# Patient Record
Sex: Male | Born: 1965
Health system: Southern US, Community
[De-identification: ages and names within clinical notes are randomized; demographics above are authoritative.]

## PROBLEM LIST (undated history)

## (undated) DIAGNOSIS — Z860100 Personal history of colon polyps, unspecified: Secondary | ICD-10-CM

## (undated) DIAGNOSIS — Z8709 Personal history of other diseases of the respiratory system: Secondary | ICD-10-CM

## (undated) DIAGNOSIS — M1990 Unspecified osteoarthritis, unspecified site: Secondary | ICD-10-CM

## (undated) DIAGNOSIS — R350 Frequency of micturition: Secondary | ICD-10-CM

## (undated) DIAGNOSIS — M549 Dorsalgia, unspecified: Secondary | ICD-10-CM

## (undated) DIAGNOSIS — R200 Anesthesia of skin: Principal | ICD-10-CM

## (undated) DIAGNOSIS — J189 Pneumonia, unspecified organism: Secondary | ICD-10-CM

## (undated) DIAGNOSIS — R351 Nocturia: Secondary | ICD-10-CM

## (undated) DIAGNOSIS — Z9889 Other specified postprocedural states: Secondary | ICD-10-CM

## (undated) DIAGNOSIS — R42 Dizziness and giddiness: Secondary | ICD-10-CM

## (undated) DIAGNOSIS — Q0700 Arnold-Chiari syndrome without spina bifida or hydrocephalus: Secondary | ICD-10-CM

## (undated) DIAGNOSIS — F419 Anxiety disorder, unspecified: Secondary | ICD-10-CM

## (undated) DIAGNOSIS — F32A Depression, unspecified: Secondary | ICD-10-CM

## (undated) DIAGNOSIS — F329 Major depressive disorder, single episode, unspecified: Secondary | ICD-10-CM

## (undated) DIAGNOSIS — R112 Nausea with vomiting, unspecified: Secondary | ICD-10-CM

## (undated) DIAGNOSIS — G8929 Other chronic pain: Secondary | ICD-10-CM

## (undated) DIAGNOSIS — R531 Weakness: Secondary | ICD-10-CM

## (undated) DIAGNOSIS — Z9289 Personal history of other medical treatment: Secondary | ICD-10-CM

## (undated) DIAGNOSIS — Z8619 Personal history of other infectious and parasitic diseases: Secondary | ICD-10-CM

## (undated) HISTORY — DX: Anesthesia of skin: R20.0

## (undated) HISTORY — PX: OTHER SURGICAL HISTORY: SHX169

## (undated) HISTORY — DX: Arnold-Chiari syndrome without spina bifida or hydrocephalus: Q07.00

## (undated) HISTORY — PX: CRANIECTOMY SUBOCCIPITAL W/ CERVICAL LAMINECTOMY / CHIARI: SUR327

## (undated) HISTORY — PX: COLONOSCOPY: SHX174

## (undated) HISTORY — PX: ESOPHAGOGASTRODUODENOSCOPY: SHX1529

---

## 1989-06-22 HISTORY — PX: THUMB ARTHROSCOPY: SHX2509

## 2000-11-23 ENCOUNTER — Encounter: Admission: RE | Admit: 2000-11-23 | Discharge: 2000-11-23 | Payer: Self-pay | Admitting: Urology

## 2000-11-23 ENCOUNTER — Encounter: Payer: Self-pay | Admitting: Urology

## 2001-01-03 ENCOUNTER — Other Ambulatory Visit: Admission: RE | Admit: 2001-01-03 | Discharge: 2001-01-03 | Payer: Self-pay | Admitting: Urology

## 2012-11-24 ENCOUNTER — Other Ambulatory Visit: Payer: Self-pay | Admitting: Family Medicine

## 2012-11-24 DIAGNOSIS — Z139 Encounter for screening, unspecified: Secondary | ICD-10-CM

## 2012-11-24 DIAGNOSIS — M5412 Radiculopathy, cervical region: Secondary | ICD-10-CM

## 2012-11-28 ENCOUNTER — Ambulatory Visit
Admission: RE | Admit: 2012-11-28 | Discharge: 2012-11-28 | Disposition: A | Discharge status: Home / Self Care | Payer: PRIVATE HEALTH INSURANCE | Source: Ambulatory Visit | Attending: Family Medicine | Admitting: Family Medicine

## 2012-11-28 DIAGNOSIS — M5412 Radiculopathy, cervical region: Secondary | ICD-10-CM

## 2012-11-28 DIAGNOSIS — Z139 Encounter for screening, unspecified: Secondary | ICD-10-CM

## 2012-12-05 ENCOUNTER — Other Ambulatory Visit: Payer: Self-pay

## 2013-03-30 ENCOUNTER — Other Ambulatory Visit: Payer: Self-pay | Admitting: Neurosurgery

## 2013-03-31 ENCOUNTER — Encounter (HOSPITAL_COMMUNITY): Payer: Self-pay | Admitting: Pharmacist

## 2013-04-04 ENCOUNTER — Other Ambulatory Visit (HOSPITAL_COMMUNITY): Payer: Self-pay | Admitting: *Deleted

## 2013-04-04 NOTE — Pre-Procedure Instructions (Signed)
Tony Richardson  04/04/2013   Your procedure is scheduled on:  Friday, April 07, 2013 at 7:30 AM.   Report to [facility] Entrance "A" at 5:30 AM.   Call this number if you have problems the morning of surgery: [phone]   Remember:   Do not eat food or drink liquids after midnight Thursday, 04/06/13.   Take these medicines the morning of surgery with A SIP OF WATER: gabapentin (NEURONTIN)    Do not wear jewelry.  Do not wear lotions, powders, or cologne. You may wear deodorant.  Do not shave 48 hours prior to surgery. Men may shave face and neck.  Do not bring valuables to the hospital.  [facility] is not responsible                  for any belongings or valuables.               Contacts, dentures or bridgework may not be worn into surgery.  Leave suitcase in the car. After surgery it may be brought to your room.  For patients admitted to the hospital, discharge time is determined by your                treatment team.             Special Instructions: Shower using CHG 2 nights before surgery and the night before surgery.  If you shower the day of surgery use CHG.  Use special wash - you have one bottle of CHG for all showers.  You should use approximately 1/3 of the bottle for each shower.   Please read over the following fact sheets that you were given: Pain Booklet, Coughing and Deep Breathing, Blood Transfusion Information, MRSA Information and Surgical Site Infection Prevention

## 2013-04-05 ENCOUNTER — Encounter (HOSPITAL_COMMUNITY)
Admission: RE | Admit: 2013-04-05 | Discharge: 2013-04-05 | Disposition: A | Discharge status: Home / Self Care | Payer: PRIVATE HEALTH INSURANCE | Source: Ambulatory Visit | Attending: Neurosurgery | Admitting: Neurosurgery

## 2013-04-05 ENCOUNTER — Encounter (HOSPITAL_COMMUNITY): Payer: Self-pay

## 2013-04-05 DIAGNOSIS — Z01812 Encounter for preprocedural laboratory examination: Secondary | ICD-10-CM | POA: Insufficient documentation

## 2013-04-05 LAB — BASIC METABOLIC PANEL
BUN: 6 mg/dL (ref 6–23)
CO2: 26 mEq/L (ref 19–32)
Creatinine, Ser: 0.71 mg/dL (ref 0.50–1.35)
GFR calc Af Amer: >90 mL/min (ref >90)
GFR calc non Af Amer: >90 mL/min (ref >90)

## 2013-04-05 LAB — CBC
MCHC: 34.3 g/dL (ref 30.0–36.0)
RBC: 4.65 MIL/uL (ref 4.22–5.81)
RDW: 13.5 % (ref 11.5–15.5)

## 2013-04-05 LAB — SURGICAL PCR SCREEN
MRSA, PCR: NEGATIVE
Staphylococcus aureus: NEGATIVE

## 2013-04-05 LAB — ABO/RH: ABO/RH(D): A POS

## 2013-04-05 NOTE — Pre-Procedure Instructions (Signed)
MASIN SHATTO  04/05/2013   Your procedure is scheduled on:  04/07/13  Report to [facility]  2 * 3 at 530 AM.  Call this number if you have problems the morning of surgery: [phone]   Remember:   Do not eat food or drink liquids after midnight.   Take these medicines the morning of surgery with A SIP OF WATER: neurontin   Do not wear jewelry, make-up or nail polish.  Do not wear lotions, powders, or perfumes. You may wear deodorant.  Do not shave 48 hours prior to surgery. Men may shave face and neck.  Do not bring valuables to the hospital.  [facility] is not responsible                  for any belongings or valuables.               Contacts, dentures or bridgework may not be worn into surgery.  Leave suitcase in the car. After surgery it may be brought to your room.  For patients admitted to the hospital, discharge time is determined by your                treatment team.               Patients discharged the day of surgery will not be allowed to drive  home.  Name and phone number of your driver: wife  Special Instructions: Shower using CHG 2 nights before surgery and the night before surgery.  If you shower the day of surgery use CHG.  Use special wash - you have one bottle of CHG for all showers.  You should use approximately 1/3 of the bottle for each shower.   Please read over the following fact sheets that you were given: Pain Booklet, Coughing and Deep Breathing, Blood Transfusion Information, MRSA Information and Surgical Site Infection Prevention

## 2013-04-05 NOTE — Pre-Procedure Instructions (Signed)
KALADIN NOSEWORTHY  04/05/2013   Your procedure is scheduled on:  04/07/13  Report to [facility]  2 * 3 at 530 AM.  Call this number if you have problems the morning of surgery: [phone]   Remember:   Do not eat food or drink liquids after midnight.   Take these medicines the morning of surgery with A SIP OF WATER: neurotin   Do not wear jewelry, make-up or nail polish.  Do not wear lotions, powders, or perfumes. You may wear deodorant.  Do not shave 48 hours prior to surgery. Men may shave face and neck.  Do not bring valuables to the hospital.  [facility] is not responsible                  for any belongings or valuables.               Contacts, dentures or bridgework may not be worn into surgery.  Leave suitcase in the car. After surgery it may be brought to your room.  For patients admitted to the hospital, discharge time is determined by your                treatment team.               Patients discharged the day of surgery will not be allowed to drive  home.  Name and phone number of your driver: family  Special Instructions: Incentive Spirometry - Practice and bring it with you on the day of surgery.   Please read over the following fact sheets that you were given: Pain Booklet, Coughing and Deep Breathing, Blood Transfusion Information, MRSA Information and Surgical Site Infection Prevention

## 2013-04-06 MED ORDER — CEFAZOLIN SODIUM-DEXTROSE 2-3 GM-% IV SOLR
2.0000 g | INTRAVENOUS | Status: AC
  Administered 2013-04-07: 2 g via INTRAVENOUS
  Filled 2013-04-06: qty 50

## 2013-04-07 ENCOUNTER — Encounter (HOSPITAL_COMMUNITY)
Admission: RE | Disposition: A | Discharge status: Home / Self Care | Payer: Self-pay | Source: Ambulatory Visit | Attending: Neurosurgery

## 2013-04-07 ENCOUNTER — Encounter (HOSPITAL_COMMUNITY): Payer: PRIVATE HEALTH INSURANCE | Admitting: Certified Registered Nurse Anesthetist

## 2013-04-07 ENCOUNTER — Inpatient Hospital Stay (HOSPITAL_COMMUNITY)
Admission: RE | Admit: 2013-04-07 | Discharge: 2013-04-12 | DRG: 030 | Disposition: A | Discharge status: Home / Self Care | Payer: PRIVATE HEALTH INSURANCE | Source: Ambulatory Visit | Attending: Neurosurgery | Admitting: Neurosurgery

## 2013-04-07 ENCOUNTER — Inpatient Hospital Stay (HOSPITAL_COMMUNITY): Payer: PRIVATE HEALTH INSURANCE | Admitting: Certified Registered Nurse Anesthetist

## 2013-04-07 ENCOUNTER — Encounter (HOSPITAL_COMMUNITY): Payer: Self-pay | Admitting: *Deleted

## 2013-04-07 DIAGNOSIS — G935 Compression of brain: Principal | ICD-10-CM | POA: Diagnosis present

## 2013-04-07 DIAGNOSIS — Z01812 Encounter for preprocedural laboratory examination: Secondary | ICD-10-CM

## 2013-04-07 DIAGNOSIS — M47812 Spondylosis without myelopathy or radiculopathy, cervical region: Secondary | ICD-10-CM | POA: Diagnosis present

## 2013-04-07 DIAGNOSIS — M5030 Other cervical disc degeneration, unspecified cervical region: Secondary | ICD-10-CM | POA: Diagnosis present

## 2013-04-07 HISTORY — PX: SUBOCCIPITAL CRANIECTOMY CERVICAL LAMINECTOMY: SHX5404

## 2013-04-07 SURGERY — SUBOCCIPITAL CRANIECTOMY CERVICAL LAMINECTOMY/DURAPLASTY
Anesthesia: General | Wound class: Clean

## 2013-04-07 MED ORDER — HYDROMORPHONE HCL PF 1 MG/ML IJ SOLN
INTRAMUSCULAR | Status: AC
  Filled 2013-04-07: qty 1

## 2013-04-07 MED ORDER — GABAPENTIN 600 MG PO TABS
600.0000 mg | ORAL_TABLET | Freq: Three times a day (TID) | ORAL | Status: DC
  Administered 2013-04-07 – 2013-04-12 (×16): 600 mg via ORAL
  Filled 2013-04-07 (×18): qty 1

## 2013-04-07 MED ORDER — SODIUM CHLORIDE 0.9 % IV SOLN: 250.0000 mL | INTRAVENOUS | Status: DC

## 2013-04-07 MED ORDER — FENTANYL CITRATE 0.05 MG/ML IJ SOLN
INTRAMUSCULAR | Status: DC | PRN
Start: 2013-04-07 — End: 2013-04-07
  Administered 2013-04-07: 250 ug via INTRAVENOUS

## 2013-04-07 MED ORDER — ACETAMINOPHEN 650 MG RE SUPP: 650.0000 mg | RECTAL | Status: DC | PRN

## 2013-04-07 MED ORDER — ROCURONIUM BROMIDE 100 MG/10ML IV SOLN
INTRAVENOUS | Status: DC | PRN
  Administered 2013-04-07: 50 mg via INTRAVENOUS

## 2013-04-07 MED ORDER — MIDAZOLAM HCL 5 MG/5ML IJ SOLN
INTRAMUSCULAR | Status: DC | PRN
  Administered 2013-04-07: 2 mg via INTRAVENOUS

## 2013-04-07 MED ORDER — HEMOSTATIC AGENTS (NO CHARGE) OPTIME
TOPICAL | Status: DC | PRN
  Administered 2013-04-07: 1 via TOPICAL

## 2013-04-07 MED ORDER — DIPHENHYDRAMINE HCL 50 MG/ML IJ SOLN: 12.5000 mg | Freq: Four times a day (QID) | INTRAMUSCULAR | Status: DC | PRN

## 2013-04-07 MED ORDER — OXYCODONE-ACETAMINOPHEN 5-325 MG PO TABS
1.0000 | ORAL_TABLET | ORAL | Status: DC | PRN
  Administered 2013-04-08: 2 via ORAL
  Administered 2013-04-08: 1 via ORAL
  Administered 2013-04-08 – 2013-04-10 (×8): 2 via ORAL
  Administered 2013-04-11 (×2): 1 via ORAL
  Administered 2013-04-11 – 2013-04-12 (×2): 2 via ORAL
  Administered 2013-04-12: 1 via ORAL
  Filled 2013-04-07 (×3): qty 2
  Filled 2013-04-07: qty 1
  Filled 2013-04-07: qty 2
  Filled 2013-04-07: qty 1
  Filled 2013-04-07 (×6): qty 2
  Filled 2013-04-07: qty 1
  Filled 2013-04-07 (×2): qty 2

## 2013-04-07 MED ORDER — HYDRALAZINE HCL 20 MG/ML IJ SOLN: 5.0000 mg | INTRAMUSCULAR | Status: DC | PRN

## 2013-04-07 MED ORDER — DEXAMETHASONE 4 MG PO TABS
4.0000 mg | ORAL_TABLET | Freq: Four times a day (QID) | ORAL | Status: DC
  Administered 2013-04-07 – 2013-04-12 (×15): 4 mg via ORAL
  Filled 2013-04-07 (×23): qty 1

## 2013-04-07 MED ORDER — ONDANSETRON HCL 4 MG/2ML IJ SOLN
4.0000 mg | INTRAMUSCULAR | Status: DC | PRN
  Administered 2013-04-08 – 2013-04-12 (×6): 4 mg via INTRAVENOUS
  Filled 2013-04-07 (×6): qty 2

## 2013-04-07 MED ORDER — PROPOFOL 10 MG/ML IV BOLUS
INTRAVENOUS | Status: DC | PRN
  Administered 2013-04-07: 200 mg via INTRAVENOUS

## 2013-04-07 MED ORDER — GLYCOPYRROLATE 0.2 MG/ML IJ SOLN
INTRAMUSCULAR | Status: DC | PRN
  Administered 2013-04-07: .4 mg via INTRAVENOUS

## 2013-04-07 MED ORDER — BUPIVACAINE-EPINEPHRINE 0.5% -1:200000 IJ SOLN
INTRAMUSCULAR | Status: DC | PRN
  Administered 2013-04-07: 20 mL

## 2013-04-07 MED ORDER — THROMBIN 5000 UNITS EX SOLR
CUTANEOUS | Status: DC | PRN
  Administered 2013-04-07 (×2): 5000 [IU] via TOPICAL

## 2013-04-07 MED ORDER — ONDANSETRON HCL 4 MG/2ML IJ SOLN
INTRAMUSCULAR | Status: DC | PRN
  Administered 2013-04-07: 4 mg via INTRAMUSCULAR

## 2013-04-07 MED ORDER — SODIUM CHLORIDE 0.9 % IJ SOLN
3.0000 mL | Freq: Two times a day (BID) | INTRAMUSCULAR | Status: DC
  Administered 2013-04-07 – 2013-04-11 (×10): 3 mL via INTRAVENOUS

## 2013-04-07 MED ORDER — DIPHENHYDRAMINE HCL 12.5 MG/5ML PO ELIX
12.5000 mg | ORAL_SOLUTION | Freq: Four times a day (QID) | ORAL | Status: DC | PRN
  Filled 2013-04-07: qty 5

## 2013-04-07 MED ORDER — SODIUM CHLORIDE 0.9 % IJ SOLN: 3.0000 mL | INTRAMUSCULAR | Status: DC | PRN

## 2013-04-07 MED ORDER — OXYCODONE HCL 5 MG/5ML PO SOLN: 5.0000 mg | Freq: Once | ORAL | Status: DC | PRN

## 2013-04-07 MED ORDER — ACETAMINOPHEN 325 MG PO TABS: 650.0000 mg | ORAL_TABLET | ORAL | Status: DC | PRN

## 2013-04-07 MED ORDER — SODIUM CHLORIDE 0.9 % IV SOLN
INTRAVENOUS | Status: DC
  Administered 2013-04-07 – 2013-04-08 (×3): via INTRAVENOUS

## 2013-04-07 MED ORDER — OXYCODONE HCL 5 MG PO TABS: 5.0000 mg | ORAL_TABLET | Freq: Once | ORAL | Status: DC | PRN

## 2013-04-07 MED ORDER — PHENOL 1.4 % MT LIQD: 1.0000 | OROMUCOSAL | Status: DC | PRN

## 2013-04-07 MED ORDER — LIDOCAINE HCL (CARDIAC) 20 MG/ML IV SOLN
INTRAVENOUS | Status: DC | PRN
  Administered 2013-04-07: 100 mg via INTRAVENOUS

## 2013-04-07 MED ORDER — DEXAMETHASONE SODIUM PHOSPHATE 4 MG/ML IJ SOLN
4.0000 mg | Freq: Four times a day (QID) | INTRAMUSCULAR | Status: DC
  Administered 2013-04-07 – 2013-04-11 (×6): 4 mg via INTRAVENOUS
  Filled 2013-04-07 (×22): qty 1

## 2013-04-07 MED ORDER — MORPHINE SULFATE (PF) 1 MG/ML IV SOLN
INTRAVENOUS | Status: DC
  Administered 2013-04-07: 11:00:00 via INTRAVENOUS
  Administered 2013-04-07: 25 mL via INTRAVENOUS
  Administered 2013-04-07: 19.5 mg via INTRAVENOUS
  Administered 2013-04-08: 10.5 mg via INTRAVENOUS
  Administered 2013-04-08: 08:00:00 via INTRAVENOUS
  Administered 2013-04-08: 16 mg via INTRAVENOUS
  Filled 2013-04-07 (×3): qty 25

## 2013-04-07 MED ORDER — ZOLPIDEM TARTRATE 5 MG PO TABS
10.0000 mg | ORAL_TABLET | Freq: Every evening | ORAL | Status: DC | PRN
  Filled 2013-04-07: qty 2

## 2013-04-07 MED ORDER — NEOSTIGMINE METHYLSULFATE 1 MG/ML IJ SOLN
INTRAMUSCULAR | Status: DC | PRN
  Administered 2013-04-07: 3 mg via INTRAVENOUS

## 2013-04-07 MED ORDER — 0.9 % SODIUM CHLORIDE (POUR BTL) OPTIME
TOPICAL | Status: DC | PRN
  Administered 2013-04-07 (×2): 1000 mL

## 2013-04-07 MED ORDER — ONDANSETRON HCL 4 MG/2ML IJ SOLN
INTRAMUSCULAR | Status: AC
  Filled 2013-04-07: qty 2

## 2013-04-07 MED ORDER — PROMETHAZINE HCL 25 MG/ML IJ SOLN: 6.2500 mg | INTRAMUSCULAR | Status: DC | PRN

## 2013-04-07 MED ORDER — DIAZEPAM 5 MG PO TABS: 5.0000 mg | ORAL_TABLET | Freq: Four times a day (QID) | ORAL | Status: DC | PRN

## 2013-04-07 MED ORDER — SODIUM CHLORIDE 0.9 % IJ SOLN: 9.0000 mL | INTRAMUSCULAR | Status: DC | PRN

## 2013-04-07 MED ORDER — LABETALOL HCL 5 MG/ML IV SOLN: 5.0000 mg | INTRAVENOUS | Status: DC | PRN

## 2013-04-07 MED ORDER — MORPHINE SULFATE (PF) 1 MG/ML IV SOLN
INTRAVENOUS | Status: AC
  Administered 2013-04-07: 12:00:00
  Filled 2013-04-07: qty 25

## 2013-04-07 MED ORDER — HYDROMORPHONE HCL PF 1 MG/ML IJ SOLN
0.2500 mg | INTRAMUSCULAR | Status: DC | PRN
  Administered 2013-04-07: 0.5 mg via INTRAVENOUS

## 2013-04-07 MED ORDER — LACTATED RINGERS IV SOLN
INTRAVENOUS | Status: DC | PRN
  Administered 2013-04-07 (×2): via INTRAVENOUS

## 2013-04-07 MED ORDER — NALOXONE HCL 0.4 MG/ML IJ SOLN: 0.4000 mg | INTRAMUSCULAR | Status: DC | PRN

## 2013-04-07 MED ORDER — CEFAZOLIN SODIUM 1-5 GM-% IV SOLN
1.0000 g | Freq: Three times a day (TID) | INTRAVENOUS | Status: AC
  Administered 2013-04-07 (×2): 1 g via INTRAVENOUS
  Filled 2013-04-07 (×2): qty 50

## 2013-04-07 MED ORDER — MENTHOL 3 MG MT LOZG: 1.0000 | LOZENGE | OROMUCOSAL | Status: DC | PRN

## 2013-04-07 MED ORDER — ONDANSETRON HCL 4 MG/2ML IJ SOLN
4.0000 mg | Freq: Four times a day (QID) | INTRAMUSCULAR | Status: DC | PRN
  Administered 2013-04-07: 4 mg via INTRAVENOUS

## 2013-04-07 MED ORDER — DEXAMETHASONE SODIUM PHOSPHATE 4 MG/ML IJ SOLN
INTRAMUSCULAR | Status: DC | PRN
  Administered 2013-04-07: 8 mg via INTRAVENOUS

## 2013-04-07 SURGICAL SUPPLY — 62 items
BENZOIN TINCTURE PRP APPL 2/3 (GAUZE/BANDAGES/DRESSINGS) ×2 IMPLANT
BLADE SURG ROTATE 9660 (MISCELLANEOUS) ×2 IMPLANT
BLADE ULTRA TIP 2M (BLADE) ×2 IMPLANT
BUR ACORN 6.0 PRECISION (BURR) ×2 IMPLANT
BUR MATCHSTICK NEURO 3.0 LAGG (BURR) IMPLANT
BUR RND OSTEON ELITE 6.0 (BURR) IMPLANT
CANISTER SUCTION 2500CC (MISCELLANEOUS) ×2 IMPLANT
CLIP TI MEDIUM 6 (CLIP) IMPLANT
CONT SPEC 4OZ CLIKSEAL STRL BL (MISCELLANEOUS) ×2 IMPLANT
CORDS BIPOLAR (ELECTRODE) ×2 IMPLANT
COVER MAYO STAND STRL (DRAPES) IMPLANT
COVER TABLE BACK 60X90 (DRAPES) IMPLANT
DRAPE LAPAROTOMY 100X72 PEDS (DRAPES) ×2 IMPLANT
DRAPE MICROSCOPE LEICA (MISCELLANEOUS) ×2 IMPLANT
DRAPE WARM FLUID 44X44 (DRAPE) ×2 IMPLANT
DURAGUARD 04CMX04CM ×2 IMPLANT
DURASEAL APPLICATOR TIP (TIP) ×4 IMPLANT
DURASEAL SPINE SEALANT 3ML (MISCELLANEOUS) ×4 IMPLANT
ELECT BLADE 4.0 EZ CLEAN MEGAD (MISCELLANEOUS) ×2
ELECT CAUTERY BLADE 6.4 (BLADE) ×2 IMPLANT
ELECT REM PT RETURN 9FT ADLT (ELECTROSURGICAL) ×2
ELECTRODE BLDE 4.0 EZ CLN MEGD (MISCELLANEOUS) ×1 IMPLANT
ELECTRODE REM PT RTRN 9FT ADLT (ELECTROSURGICAL) ×1 IMPLANT
GAUZE SPONGE 4X4 16PLY XRAY LF (GAUZE/BANDAGES/DRESSINGS) IMPLANT
GLOVE BIOGEL M 8.0 STRL (GLOVE) ×2 IMPLANT
GLOVE ECLIPSE 7.5 STRL STRAW (GLOVE) ×10 IMPLANT
GLOVE EXAM NITRILE LRG STRL (GLOVE) IMPLANT
GLOVE EXAM NITRILE MD LF STRL (GLOVE) IMPLANT
GLOVE EXAM NITRILE XL STR (GLOVE) IMPLANT
GLOVE EXAM NITRILE XS STR PU (GLOVE) IMPLANT
GLOVE INDICATOR 8.0 STRL GRN (GLOVE) ×2 IMPLANT
GOWN BRE IMP SLV AUR LG STRL (GOWN DISPOSABLE) ×2 IMPLANT
GOWN BRE IMP SLV AUR XL STRL (GOWN DISPOSABLE) ×2 IMPLANT
GOWN STRL REIN 2XL LVL4 (GOWN DISPOSABLE) IMPLANT
KIT BASIN OR (CUSTOM PROCEDURE TRAY) ×2 IMPLANT
KIT ROOM TURNOVER OR (KITS) ×2 IMPLANT
NEEDLE HYPO 25X1 1.5 SAFETY (NEEDLE) ×2 IMPLANT
NEEDLE SPNL 22GX3.5 QUINCKE BK (NEEDLE) ×2 IMPLANT
NS IRRIG 1000ML POUR BTL (IV SOLUTION) ×4 IMPLANT
PACK CRANIOTOMY (CUSTOM PROCEDURE TRAY) ×2 IMPLANT
PAD ARMBOARD 7.5X6 YLW CONV (MISCELLANEOUS) ×6 IMPLANT
PATTIES SURGICAL 1/4 X 3 (GAUZE/BANDAGES/DRESSINGS) IMPLANT
PIN MAYFIELD SKULL DISP (PIN) IMPLANT
RUBBERBAND STERILE (MISCELLANEOUS) IMPLANT
SPONGE GAUZE 4X4 12PLY (GAUZE/BANDAGES/DRESSINGS) ×2 IMPLANT
SPONGE LAP 4X18 X RAY DECT (DISPOSABLE) IMPLANT
SPONGE SURGIFOAM ABS GEL 100C (HEMOSTASIS) ×2 IMPLANT
STAPLER SKIN PROX WIDE 3.9 (STAPLE) ×2 IMPLANT
STRIP CLOSURE SKIN 1/4X4 (GAUZE/BANDAGES/DRESSINGS) IMPLANT
SUT ETHILON 3 0 FSL (SUTURE) IMPLANT
SUT NURALON 4 0 TR CR/8 (SUTURE) ×2 IMPLANT
SUT PROLENE 6 0 BV (SUTURE) IMPLANT
SUT VIC AB 0 CT1 18XCR BRD8 (SUTURE) ×2 IMPLANT
SUT VIC AB 0 CT1 8-18 (SUTURE) ×2
SUT VIC AB 2-0 CP2 18 (SUTURE) ×4 IMPLANT
SUT VIC AB 3-0 SH 8-18 (SUTURE) ×2 IMPLANT
SYR 20ML ECCENTRIC (SYRINGE) ×2 IMPLANT
TOWEL OR 17X24 6PK STRL BLUE (TOWEL DISPOSABLE) ×2 IMPLANT
TOWEL OR 17X26 10 PK STRL BLUE (TOWEL DISPOSABLE) ×2 IMPLANT
TRAY FOLEY CATH 14FRSI W/METER (CATHETERS) ×2 IMPLANT
UNDERPAD 30X30 INCONTINENT (UNDERPADS AND DIAPERS) IMPLANT
WATER STERILE IRR 1000ML POUR (IV SOLUTION) ×2 IMPLANT

## 2013-04-07 NOTE — Progress Notes (Signed)
Patient received from PACU via bed. VSS. Drowsy, arousable, follows commands appropriately. Orientedx4. Foley catheter draining clear yellow urine. PIVx2 and R radial aline. NS@ 75cc/hr. SCDs on BLE. C/O post-surgical pain 8/10-reinforced using PCA for pain management. See flowsheet for full assessment. Family updated at bedside. Continuing to monitor.

## 2013-04-07 NOTE — Progress Notes (Signed)
UR completed.  Tamula Morrical, RN BSN [facility] CCM Trauma/Neuro ICU Case Manager [phone]  

## 2013-04-07 NOTE — Transfer of Care (Signed)
Immediate Anesthesia Transfer of Care Note  Patient: Tony Richardson  Procedure(s) Performed: Procedure(s): SUBOCCIPITAL CRANIECTOMY CERVICAL LAMINECTOMY/DURAPLASTY RESECTION POSTERIOR CERVICAL ONE (N/A)  Patient Location: PACU  Anesthesia Type:General  Level of Consciousness: awake, alert  and oriented  Airway & Oxygen Therapy: Patient Spontanous Breathing and Patient connected to nasal cannula oxygen  Post-op Assessment: Report given to PACU RN and Post -op Vital signs reviewed and stable  Post vital signs: Reviewed and stable  Complications: No apparent anesthesia complications

## 2013-04-07 NOTE — Progress Notes (Signed)
4cc morphine PCA wasted in sink. Witnessed by Cyndie Chime, RN

## 2013-04-07 NOTE — Progress Notes (Signed)
Op [phone]

## 2013-04-07 NOTE — Anesthesia Postprocedure Evaluation (Signed)
Anesthesia Post Note  Patient: Tony Richardson  Procedure(s) Performed: Procedure(s) (LRB): SUBOCCIPITAL CRANIECTOMY CERVICAL LAMINECTOMY/DURAPLASTY RESECTION POSTERIOR CERVICAL ONE (N/A)  Anesthesia type: general  Patient location: PACU  Post pain: Pain level controlled  Post assessment: Patient's Cardiovascular Status Stable  Last Vitals:  Filed Vitals:   04/07/13 1051  BP: 125/94  Pulse: 66  Temp:   Resp: 14    Post vital signs: Reviewed and stable  Level of consciousness: sedated  Complications: No apparent anesthesia complications

## 2013-04-07 NOTE — Op Note (Signed)
NAMEGILLES, TRIMPE                   ACCOUNT NO.:  [id number]  MEDICAL RECORD NO.:  [id number]  LOCATION:  3M13C                        FACILITY:  [facility]  PHYSICIAN:  Hilda Lias, M.D.   DATE OF BIRTH:  05-Oct-1965  DATE OF PROCEDURE:  04/07/2013 DATE OF DISCHARGE:                              OPERATIVE REPORT   PREOPERATIVE DIAGNOSES:  Arnold-Chiari malformation.  Degenerative disk disease, cervical 5-6, 6-7.  POSTOPERATIVE DIAGNOSES:  Arnold-Chiari malformation. Degenerative disk disease, cervical 5-6, 6-7. PROCEDURE:  Bilateral occipital craniectomy.  Removal of the arch of C1. Decompression of the posterior fossa.  A pouch using Dura-Guard. Microscope.  SURGEON:  Hilda Lias, M.D.  ASSISTANT:  Hewitt Shorts, M.D.  CLINICAL HISTORY:  Mr. Shugars is a gentleman who was seen by me complaining of neck pain, dizziness and __________ headache which gets worse with cough.  Also weakness in _both_________ upper extremities.  MRI showed that he has Arnold-Chiari malformation with the tonsils of the cerebellum going to C1 and beyond.  Also, he has a degenerative disk disease in the cervical 5-6, 6-7.  Surgery was advised and the procedure was to decompress the posterior fossa.  He knew the risk of  the surgery.  Later on, we will address the problem in the anterior cervical spine.  DESCRIPTION OF PROCEDURE:  The patient was taken to the OR and after intubation, 3 pins were applied to the head.  He was positioned in prone manner, and the area was shaved and prepped with Betadine.  Drapes were applied.  Then, midline incision from the inion to C2-C3 was made through the skin, subcutaneous tissue.  Muscles were retracted laterally.  At the end, we had good exposure of both __________ occipital bones as well as for C1 and C2.  Then using the drill, we thin out the bone __________to do a craniectomy which was accomplished at the end using the Kerrison punch.  We had a wide  craniectomy with plenty of space for the cerebellar hemisphere.  Then, we made an incision in shape of Y, all the way to the midline.  What we found was that the cerebellum, tonsils were going below C1 and because of that, we proceeded with resection of the arch of C1.  The cerebellum was close about 5 mm away from the C2. Incision was made in the midline.  Decompression was achieved.  We took care not to open the arachnoid space.  Then, using Dura-Guard, we tailored a pouch to allow more room for the posterior element.  The Dura- Guard was attached to the edges of the dura mater using 4-0 Nurolon.  At the end, we had a good decompression with plenty of space.  Valsalva maneuver was negative.  Then, surgical glue were used for the edges.  Then, the wound was closed with 4 different layers with Vicryl and staples.  The patient is stable and he is going to go to the intensive care unit.          ______________________________ Hilda Lias, M.D.     EB/MEDQ  D:  04/07/2013  T:  04/07/2013  Job:  [id number]

## 2013-04-07 NOTE — Preoperative (Signed)
Beta Blockers   Reason not to administer Beta Blockers:Not Applicable 

## 2013-04-07 NOTE — H&P (Signed)
Tony Richardson is an 47 y.o. male.   Chief Complaint: headacheHPI: patient seen in the office with complains of headache which gets worse with coughing, associated with eye blurriness ,pain to the arms right worse than left. No better with treatment  Past Medical History  Diagnosis Date  . ZOXWRUEA(540.9)     Past Surgical History  Procedure Laterality Date  . Thumb arthroscopy      History reviewed. No pertinent family history. Social History:  reports that he has never smoked. He has never used smokeless tobacco. He reports that he drinks alcohol. He reports that he does not use illicit drugs.  Allergies: No Known Allergies  Medications Prior to Admission  Medication Sig Dispense Refill  . gabapentin (NEURONTIN) 600 MG tablet Take 600 mg by mouth 3 (three) times daily.      . sildenafil (VIAGRA) 100 MG tablet Take 100 mg by mouth daily as needed for erectile dysfunction.        Results for orders placed during the hospital encounter of 04/05/13 (from the past 48 hour(s))  SURGICAL PCR SCREEN     Status: None   Collection Time    04/05/13  8:30 AM      Result Value Range   MRSA, PCR NEGATIVE  NEGATIVE   Staphylococcus aureus NEGATIVE  NEGATIVE   Comment:            The Xpert SA Assay (FDA     approved for NASAL specimens     in patients over 30 years of age),     is one component of     a comprehensive surveillance     program.  Test performance has     been validated by [employer] for patients greater     than or equal to 70 year old.     It is not intended     to diagnose infection nor to     guide or monitor treatment.  BASIC METABOLIC PANEL     Status: None   Collection Time    04/05/13  8:34 AM      Result Value Range   Sodium 141  135 - 145 mEq/L   Potassium 3.6  3.5 - 5.1 mEq/L   Chloride 105  96 - 112 mEq/L   CO2 26  19 - 32 mEq/L   Glucose, Bld 96  70 - 99 mg/dL   BUN 6  6 - 23 mg/dL   Creatinine, Ser 8.11  0.50 - 1.35 mg/dL   Calcium 9.1  8.4 - 91.4  mg/dL   GFR calc non Af Amer >90  >90 mL/min   GFR calc Af Amer >90  >90 mL/min   Comment: (NOTE)     The eGFR has been calculated using the CKD EPI equation.     This calculation has not been validated in all clinical situations.     eGFR's persistently <90 mL/min signify possible Chronic Kidney     Disease.  CBC     Status: Abnormal   Collection Time    04/05/13  8:34 AM      Result Value Range   WBC 10.6 (*) 4.0 - 10.5 K/uL   RBC 4.65  4.22 - 5.81 MIL/uL   Hemoglobin 14.2  13.0 - 17.0 g/dL   HCT 78.2  95.6 - 21.3 %   MCV 89.0  78.0 - 100.0 fL   MCH 30.5  26.0 - 34.0 pg   MCHC 34.3  30.0 - 36.0 g/dL   RDW 16.1  09.6 - 04.5 %   Platelets 189  150 - 400 K/uL  TYPE AND SCREEN     Status: None   Collection Time    04/05/13  8:37 AM      Result Value Range   ABO/RH(D) A POS     Antibody Screen NEG     Sample Expiration 04/19/2013    ABO/RH     Status: None   Collection Time    04/05/13  8:37 AM      Result Value Range   ABO/RH(D) A POS     No results found.  Review of Systems  Constitutional: Negative.   Eyes: Positive for blurred vision.  Respiratory: Negative.   Cardiovascular: Negative.   Gastrointestinal: Negative.   Genitourinary: Negative.   Musculoskeletal: Positive for neck pain.  Skin: Negative.   Neurological: Positive for sensory change, focal weakness and headaches.  Endo/Heme/Allergies: Negative.   Psychiatric/Behavioral: Negative.     Blood pressure 124/85, pulse 74, temperature 97.5 F (36.4 C), temperature source Oral, resp. rate 18, SpO2 97.00%. Physical Exam hent, some posterior tenderness. Neck, pain with movement. Cv, nl. Lungs clear. Abdomen, soft. Extremities, nl. NEURO weakness of biceps. Sensory., numbness of thumb. No biceps refkex. mti shoes Debroah Loop- Chiari malformation with cerebellum down to 11 mms below foramne  Magnum. Also ddd at cervical 56, 67  Assessment/Plan decopmfression of posterior fossa with a bioccipital craniectomy and  possible removal of c1. The procedure will not  Improve his neck and arm weakness which will involce acdf at later time. He and his family are aware of risks and benefits.  Jeidy Hoerner M 04/07/2013, 7:50 AM

## 2013-04-07 NOTE — Anesthesia Preprocedure Evaluation (Signed)
Anesthesia Evaluation  Patient identified by MRN, date of birth, ID band Patient awake    Reviewed: Allergy & Precautions, H&P , NPO status , Patient's Chart, lab work & pertinent test results  History of Anesthesia Complications Negative for: history of anesthetic complications  Airway Mallampati: II TM Distance: >3 FB Neck ROM: Full    Dental  (+) Teeth Intact and Dental Advisory Given   Pulmonary    Pulmonary exam normal       Cardiovascular negative cardio ROS      Neuro/Psych negative psych ROS   GI/Hepatic negative GI ROS, Neg liver ROS,   Endo/Other  negative endocrine ROS  Renal/GU negative Renal ROS     Musculoskeletal   Abdominal   Peds  Hematology negative hematology ROS (+)   Anesthesia Other Findings   Reproductive/Obstetrics                           Anesthesia Physical Anesthesia Plan  ASA: II  Anesthesia Plan: General   Post-op Pain Management:    Induction: Intravenous  Airway Management Planned: Oral ETT  Additional Equipment: Arterial line  Intra-op Plan:   Post-operative Plan: Extubation in OR  Informed Consent: I have reviewed the patients History and Physical, chart, labs and discussed the procedure including the risks, benefits and alternatives for the proposed anesthesia with the patient or authorized representative who has indicated his/her understanding and acceptance.   Dental advisory given  Plan Discussed with: CRNA, Anesthesiologist and Surgeon  Anesthesia Plan Comments:         Anesthesia Quick Evaluation

## 2013-04-08 MED ORDER — MORPHINE SULFATE 4 MG/ML IJ SOLN: 4.0000 mg | INTRAMUSCULAR | Status: DC | PRN

## 2013-04-08 NOTE — Progress Notes (Signed)
Subjective: Patient resting in bed, fairly comfortable. Mild incisional discomfort. Not using PCA much. Taking well by mouth. Nursing staff removed Foley this morning.  Objective: Vital signs in last 24 hours: Filed Vitals:   04/08/13 0800 04/08/13 0825 04/08/13 0900 04/08/13 1000  BP: 112/66  107/71 113/76  Pulse: 95  64 65  Temp:  98.6 F (37 C)    TempSrc:  Oral    Resp: 15  12 12   Height: 5\' 8"  (1.727 m)     Weight: 64.1 kg (141 lb 5 oz)     SpO2: 99%  99% 100%    Intake/Output from previous day: 10/17 0701 - 10/18 0700 In: 3125 [I.V.:3075; IV Piggyback:50] Out: 2085 [Urine:1935; Blood:150] Intake/Output this shift: Total I/O In: 235.5 [I.V.:235.5] Out: 225 [Urine:225]  Physical Exam:  Awake alert, oriented. Moving all extremities well. Dressing clean and dry.  Assessment/Plan: Doing well following surgery yesterday.  Will DC PCA and begin by mouth analgesics, with an IM back up. We'll begin out of bed to chair, and progress to ambulation. Will DC Aline.   Hewitt Shorts, MD 04/08/2013, 10:54 AM

## 2013-04-08 NOTE — Evaluation (Signed)
Physical Therapy Evaluation Patient Details Name: Tony Richardson MRN: [id number] DOB: [birth year] Today's Date: 04/08/2013 Time: 4098-1191 PT Time Calculation (min): 21 min  PT Assessment / Plan / Recommendation History of Present Illness  Patient is a 47 yo male admitted with headaches.  Dx with Arnold-Chiari malformation.  Is s/p bil. craniectomy to decompress posterior fossa.  Clinical Impression  Patient presents with problems listed below.  Will benefit from acute PT to maximize independence prior to discharge home with family.    PT Assessment  Patient needs continued PT services    Follow Up Recommendations  No PT follow up;Supervision - Intermittent    Does the patient have the potential to tolerate intense rehabilitation      Barriers to Discharge        Equipment Recommendations  None recommended by PT    Recommendations for Other Services     Frequency Min 3X/week    Precautions / Restrictions Precautions Precautions: None Restrictions Weight Bearing Restrictions: No   Pertinent Vitals/Pain Pain impacting mobility.      Mobility  Bed Mobility Bed Mobility: Supine to Sit;Sitting - Scoot to Edge of Bed Supine to Sit: 5: Supervision Sitting - Scoot to Edge of Bed: 5: Supervision Details for Bed Mobility Assistance: Supervision for safety only. Transfers Transfers: Sit to Stand;Stand to Sit Sit to Stand: 4: Min guard;From bed Stand to Sit: 4: Min guard;With armrests;To chair/3-in-1 Details for Transfer Assistance: Verbal cues for safety.  Assist for balance Ambulation/Gait Ambulation/Gait Assistance: 4: Min guard Ambulation Distance (Feet): 175 Feet Assistive device: None Ambulation/Gait Assistance Details: Patient with slow guarded gait.  Good balance and gait pattern. Gait Pattern: Step-through pattern Gait velocity: Slow    Exercises     PT Diagnosis: Abnormality of gait;Generalized weakness;Acute pain  PT Problem List: Decreased strength;Decreased  activity tolerance;Decreased balance;Decreased mobility;Pain PT Treatment Interventions: Gait training;Stair training;Functional mobility training;Balance training;Patient/family education     PT Goals(Current goals can be found in the care plan section) Acute Rehab PT Goals Patient Stated Goal: To go home soon PT Goal Formulation: With patient Time For Goal Achievement: 04/15/13 Potential to Achieve Goals: Good  Visit Information  Last PT Received On: 06/20/13 Assistance Needed: +1 History of Present Illness: Patient is a 47 yo male admitted with headaches.  Dx with Arnold-Chiari malformation.  Is s/p bil. craniectomy to decompress posterior fossa.       Prior Functioning  Home Living Family/patient expects to be discharged to:: Private residence Living Arrangements: Spouse/significant other;Children Available Help at Discharge: Family;Available 24 hours/day Type of Home: House Home Access: Level entry Home Layout: Two level Alternate Level Stairs-Number of Steps: 6 Alternate Level Stairs-Rails: Right Home Equipment: None Prior Function Level of Independence: Independent Communication Communication: No difficulties    Cognition  Cognition Arousal/Alertness: Awake/alert Behavior During Therapy: WFL for tasks assessed/performed Overall Cognitive Status: Within Functional Limits for tasks assessed    Extremity/Trunk Assessment Upper Extremity Assessment Upper Extremity Assessment: Overall WFL for tasks assessed Lower Extremity Assessment Lower Extremity Assessment: Overall WFL for tasks assessed   Balance Balance Balance Assessed: Yes High Level Balance High Level Balance Activites: Turns;Sudden stops High Level Balance Comments: Slight decrease in balance with turns  End of Session PT - End of Session Equipment Utilized During Treatment: Gait belt Activity Tolerance: Patient limited by fatigue;Patient limited by pain Patient left: in chair;with call bell/phone  within reach Nurse Communication: Mobility status  GP     Tony Richardson 04/08/2013, 3:47 PM Durenda Hurt. Earlene Plater,  PT, [place] Pager [phone]

## 2013-04-09 NOTE — Progress Notes (Signed)
Subjective: Patient without complaints. Has been up and ambulating in the ICU. Pain control. No nausea or vomiting.  Objective: Vital signs in last 24 hours: Filed Vitals:   04/09/13 0400 04/09/13 0500 04/09/13 0600 04/09/13 0700  BP: 120/71 116/62 122/61 115/45  Pulse: 70 76 71 86  Temp:      TempSrc:      Resp: 12 12 13 13   Height:      Weight:      SpO2: 97% 97% 96% 96%    Intake/Output from previous day: 10/18 0701 - 10/19 0700 In: 1583.5 [P.O.:540; I.V.:1043.5] Out: 575 [Urine:575] Intake/Output this shift:    Physical Exam:  Awake alert, oriented. Moving all 4 to his well. Dressing clean and dry.  Assessment/Plan: We'll transfer to 4 N., encouraged ambulation.   Hewitt Shorts, MD 04/09/2013, 7:32 AM

## 2013-04-09 NOTE — Plan of Care (Signed)
Problem: Consults Goal: Diagnosis - Craniotomy Outcome: Completed/Met Date Met:  04/09/13 ChariMalformation

## 2013-04-09 NOTE — Plan of Care (Signed)
Problem: Consults Goal: Diagnosis - Spinal Surgery [place]

## 2013-04-10 ENCOUNTER — Encounter (HOSPITAL_COMMUNITY): Payer: Self-pay | Admitting: Neurosurgery

## 2013-04-10 MED ORDER — FLEET ENEMA 7-19 GM/118ML RE ENEM
1.0000 | ENEMA | Freq: Every day | RECTAL | Status: DC | PRN
  Administered 2013-04-12: 1 via RECTAL
  Filled 2013-04-10: qty 1

## 2013-04-10 MED ORDER — SENNOSIDES-DOCUSATE SODIUM 8.6-50 MG PO TABS
1.0000 | ORAL_TABLET | Freq: Every evening | ORAL | Status: DC | PRN
  Administered 2013-04-10 – 2013-04-11 (×2): 1 via ORAL
  Filled 2013-04-10 (×3): qty 1

## 2013-04-10 NOTE — Progress Notes (Signed)
Agree with PTA.    Willye Javier, PT [phone]  

## 2013-04-10 NOTE — Progress Notes (Signed)
Physical Therapy Treatment Patient Details Name: Tony Richardson MRN: [id number] DOB: Feb 28, 1966 Today's Date: 04/10/2013 Time: 4098-1191 PT Time Calculation (min): 18 min  PT Assessment / Plan / Recommendation  History of Present Illness Patient is a 47 yo male admitted with headaches.  Dx with Arnold-Chiari malformation.  Is s/p bil. craniectomy to decompress posterior fossa.   PT Comments   All goals met and education completed. Patient walking unit with family. Will sign off  Follow Up Recommendations  No PT follow up;Supervision - Intermittent     Does the patient have the potential to tolerate intense rehabilitation     Barriers to Discharge        Equipment Recommendations  None recommended by PT    Recommendations for Other Services    Frequency     Progress towards PT Goals Progress towards PT goals: Goals met/education completed, patient discharged from PT  Plan      Precautions / Restrictions Precautions Precautions: None Restrictions Weight Bearing Restrictions: No   Pertinent Vitals/Pain Sensitivity to light    Mobility  Bed Mobility Bed Mobility: Supine to Sit;Sit to Supine Supine to Sit: 7: Independent Sitting - Scoot to Edge of Bed: 7: Independent Sit to Supine: 5: Supervision Transfers Sit to Stand: 7: Independent Stand to Sit: 7: Independent Details for Transfer Assistance: supervision for safety Ambulation/Gait Ambulation/Gait Assistance: 7: Independent Ambulation Distance (Feet): 600 Feet Assistive device: None Gait Pattern: Within Functional Limits Gait velocity: WFL Stairs: Yes Stairs Assistance: 6: Modified independent (Device/Increase time) Stair Management Technique: One rail Left Number of Stairs: 10    Exercises     PT Diagnosis:    PT Problem List:   PT Treatment Interventions:     PT Goals (current goals can now be found in the care plan section) Acute Rehab PT Goals Patient Stated Goal: not stated  Visit Information  Last  PT Received On: 04/10/13 Assistance Needed: +1 History of Present Illness: Patient is a 47 yo male admitted with headaches.  Dx with Arnold-Chiari malformation.  Is s/p bil. craniectomy to decompress posterior fossa.    Subjective Data  Patient Stated Goal: not stated   Cognition  Cognition Arousal/Alertness: Awake/alert Behavior During Therapy: WFL for tasks assessed/performed Overall Cognitive Status: Within Functional Limits for tasks assessed    Balance     End of Session PT - End of Session Activity Tolerance: Patient tolerated treatment well Patient left: in bed;with call bell/phone within reach Nurse Communication: Mobility status   GP     Fredrich Birks 04/10/2013, 2:06 PM  04/10/2013 Fredrich Birks PTA [phone] pager [phone] office

## 2013-04-10 NOTE — Evaluation (Addendum)
Occupational Therapy Evaluation Patient Details Name: Tony Richardson MRN: [id number] DOB: [birth year] Today's Date: 04/10/2013 Time: 4098-1191 OT Time Calculation (min): 11 min  OT Assessment / Plan / Recommendation History of present illness Patient is a 47 yo male admitted with headaches.  Dx with Arnold-Chiari malformation.  Is s/p bil. craniectomy to decompress posterior fossa.   Clinical Impression   Pt presents with below problem list. Pt moving well during evaluation with very slight decreased balance with head turns. Pt demonstrates weakness in upper extremities and reports decreased fine motor coordination. Pt will benefit from acute OT to increase independence prior to d/c.     OT Assessment  Patient needs continued OT Services    Follow Up Recommendations  Outpatient OT    Barriers to Discharge      Equipment Recommendations  None recommended by OT    Recommendations for Other Services    Frequency  Min 2X/week    Precautions / Restrictions Restrictions Weight Bearing Restrictions: No   Pertinent Vitals/Pain Headache 2/10. Repositioned.     ADL  Eating/Feeding: Set up Where Assessed - Eating/Feeding: Edge of bed Grooming: Set up Where Assessed - Grooming: Unsupported sitting Upper Body Bathing: Supervision/safety Where Assessed - Upper Body Bathing: Unsupported sitting Lower Body Bathing: Supervision/safety Where Assessed - Lower Body Bathing: Unsupported sit to stand Where Assessed - Upper Body Dressing: Unsupported sitting Lower Body Dressing: Minimal assistance Where Assessed - Lower Body Dressing: Unsupported sit to stand Toilet Transfer: Supervision/safety Toilet Transfer Method: Sit to Barista: Regular height toilet Toileting - Clothing Manipulation and Hygiene: Supervision/safety Where Assessed - Engineer, mining and Hygiene: Standing Tub/Shower Transfer Method: Not assessed Transfers/Ambulation Related to ADLs:  Supervision  ADL Comments: Pt moving well during session. Pt with weakness in UE's and reports problems with fine motor coordination. Gave pt handout of fine motor coordination activities and also gave theraband for UE strengthening. Explained to sit to bathe for safety and to cross legs over knees to reach LB to prevent bending head over.     OT Diagnosis: Generalized weakness  OT Problem List: Decreased strength;Impaired balance (sitting and/or standing);Decreased coordination;Decreased knowledge of use of DME or AE;Pain;Decreased knowledge of precautions;Impaired UE functional use OT Treatment Interventions: Self-care/ADL training;Therapeutic exercise;DME and/or AE instruction;Therapeutic activities;Patient/family education;Balance training   OT Goals(Current goals can be found in the care plan section) Acute Rehab OT Goals Patient Stated Goal: not stated OT Goal Formulation: With patient Time For Goal Achievement: 04/17/13 Potential to Achieve Goals: Good  Visit Information  Last OT Received On: 04/10/13 Assistance Needed: +1 History of Present Illness: Patient is a 47 yo male admitted with headaches.  Dx with Arnold-Chiari malformation.  Is s/p bil. craniectomy to decompress posterior fossa.       Prior Functioning     Home Living Family/patient expects to be discharged to:: Private residence Living Arrangements: Spouse/significant other;Children Available Help at Discharge: Family;Available 24 hours/day Type of Home: House Home Access: Level entry Home Layout: Two level Alternate Level Stairs-Number of Steps: 6 Alternate Level Stairs-Rails: Right Home Equipment: Shower seat Prior Function Level of Independence: Independent Communication Communication: No difficulties Dominant Hand: Right         Vision/Perception     Cognition  Cognition Arousal/Alertness: Awake/alert Behavior During Therapy: WFL for tasks assessed/performed Overall Cognitive Status: Within  Functional Limits for tasks assessed    Extremity/Trunk Assessment Upper Extremity Assessment Upper Extremity Assessment: RUE deficits/detail;LUE deficits/detail RUE Deficits / Details: full ROM with shoulder flexion,  however weakness with shoulder flexion with Rt worse than left.  RUE Sensation:  (reports at times he has decreased light touch) RUE Coordination: decreased fine motor (reports difficulty opening packages) LUE Deficits / Details: full ROM with shoulder flexion, however weakness with shoulder flexion with Rt weaker than left; weakness in tricep; decreased grip strength in left hand LUE Sensation:  (reports at times decreased light touch) LUE Coordination: decreased fine motor (reports difficulty opening packages)     Mobility Bed Mobility Bed Mobility: Supine to Sit;Sit to Supine Supine to Sit: 5: Supervision Sit to Supine: 5: Supervision Transfers Transfers: Sit to Stand;Stand to Sit Sit to Stand: 5: Supervision;From bed;From toilet Stand to Sit: 5: Supervision;To bed;To toilet Details for Transfer Assistance: supervision for safety     Exercise     Balance     End of Session OT - End of Session Activity Tolerance: Patient tolerated treatment well Patient left: in bed;with call bell/phone within reach;with bed alarm set  GO     Earlie Raveling OTR/L [phone] 04/10/2013, 6:24 PM

## 2013-04-10 NOTE — Progress Notes (Signed)
Patient ID: Tony Richardson, male   DOB: 10/05/1965, 47 y.o.   MRN: [id number] No weakness. C/o nausea. Neuro stable

## 2013-04-11 MED ORDER — FLEET ENEMA 7-19 GM/118ML RE ENEM: 1.0000 | ENEMA | Freq: Every day | RECTAL | Status: DC | PRN

## 2013-04-11 NOTE — Progress Notes (Signed)
UR complete.  Jamilya Sarrazin RN, MSN 

## 2013-04-11 NOTE — Progress Notes (Signed)
Pt had an episode of nausea and vomitinig. Zofran prn given. Will continue to monitor

## 2013-04-11 NOTE — Progress Notes (Signed)
Patient ID: Tony Richardson, male   DOB: 05-20-1966, 47 y.o.   MRN: [id number] Vomited this am. No weakness. Wound ok. Constipated.

## 2013-04-11 NOTE — Progress Notes (Signed)
Occupational Therapy Treatment Patient Details Name: GILMAR BUA MRN: [id number] DOB: 08-01-1965 Today's Date: 04/11/2013 Time: 6213-0865 OT Time Calculation (min): 18 min  OT Assessment / Plan / Recommendation  History of present illness Patient is a 47 yo male admitted with headaches.  Dx with Arnold-Chiari malformation.  Is s/p bil. craniectomy to decompress posterior fossa.   OT comments  Pt performed fine motor activities as well as UE exercise with theraband. Pt reported numbness in right fifth digit. Practiced shower transfer as well.   Follow Up Recommendations  Outpatient OT    Barriers to Discharge       Equipment Recommendations  None recommended by OT    Recommendations for Other Services    Frequency Min 2X/week   Progress towards OT Goals Progress towards OT goals: Progressing toward goals  Plan Discharge plan remains appropriate    Precautions / Restrictions     Pertinent Vitals/Pain Headache. Nurse came in and gave meds.     ADL  Toilet Transfer: Modified independent Toilet Transfer Method: Sit to Barista: Other (comment) (from chair with and without arms) Tub/Shower Transfer: Landscape architect Method: Science writer: Walk in shower;Other (comment);Shower seat with back Equipment Used: Other (comment) (theraband) Transfers/Ambulation Related to ADLs: Mod I for sit <> stand transfers and ambulation and Supervision for shower transfer. ADL Comments: pt performed UE exercises with theraband as well as fine motor coordination activities for hands. Pt also practiced shower transfer. OT educated to sit to bathe and have someone with him getting in and out of shower as he was holding onto bed rail and did not clear threshold without it moving on first attempt.     OT Diagnosis:    OT Problem List:   OT Treatment Interventions:     OT Goals(current goals can now be found in the care  plan section) Acute Rehab OT Goals Patient Stated Goal: not stated OT Goal Formulation: With patient Time For Goal Achievement: 04/17/13 Potential to Achieve Goals: Good ADL Goals Pt Will Perform Grooming: with modified independence;standing Pt Will Perform Tub/Shower Transfer: Shower transfer;with modified independence;ambulating;shower seat Additional ADL Goal #1: Pt will independently perform HEP to increase strength and coordination in both upper extremities.  Visit Information  Last OT Received On: 04/11/13 Assistance Needed: +1 History of Present Illness: Patient is a 47 yo male admitted with headaches.  Dx with Arnold-Chiari malformation.  Is s/p bil. craniectomy to decompress posterior fossa.    Subjective Data      Prior Functioning       Cognition  Cognition Arousal/Alertness: Awake/alert Behavior During Therapy: WFL for tasks assessed/performed Overall Cognitive Status: Within Functional Limits for tasks assessed    Mobility  Bed Mobility Bed Mobility: Not assessed Transfers Transfers: Sit to Stand;Stand to Sit Sit to Stand: 6: Modified independent (Device/Increase time);From chair/3-in-1 Stand to Sit: 6: Modified independent (Device/Increase time);To chair/3-in-1;5: Supervision Details for Transfer Assistance: cues to try not to bend so far forward when sitting to prevent pressure when sitting on simulated shower chair    Exercises      Balance     End of Session OT - End of Session Activity Tolerance: Patient tolerated treatment well Patient left: in chair;with call bell/phone within reach  GO     Earlie Raveling OTR/L [phone] 04/11/2013, 5:20 PM

## 2013-04-12 NOTE — Discharge Summary (Signed)
Physician Discharge Summary  Patient ID: MAAHIR HORST MRN: [id number] DOB/AGE: 02-19-1966 47 y.o.  Admit date: 04/07/2013 Discharge date: 04/12/2013  Admission Diagnoses:arnold-Chiari malformation. Cervical spondylosis  Discharge Diagnoses:same  Active Problems:   * No active hospital problems. *   Discharged Condition: no pain  Hospital Course: surgery  Consults none  Significant Diagnostic Studies: mri  Treatments: occipital c1 decompression  Discharge Exam: Blood pressure 124/81, pulse 76, temperature 98.4 F (36.9 C), temperature source Oral, resp. rate 18, height 5\' 8"  (1.727 m), weight 64.1 kg (141 lb 5 oz), SpO2 99.00%. Wound dry. No headache  Disposition: home to see me nov 3     Medication List    ASK your doctor about these medications       gabapentin 600 MG tablet  Commonly known as:  NEURONTIN  Take 600 mg by mouth 3 (three) times daily.     sildenafil 100 MG tablet  Commonly known as:  VIAGRA  Take 100 mg by mouth daily as needed for erectile dysfunction.         Signed: Karn Cassis 04/12/2013, 12:54 PM

## 2013-04-15 ENCOUNTER — Inpatient Hospital Stay (HOSPITAL_COMMUNITY): Payer: PRIVATE HEALTH INSURANCE

## 2013-04-15 ENCOUNTER — Emergency Department (HOSPITAL_COMMUNITY): Payer: PRIVATE HEALTH INSURANCE

## 2013-04-15 ENCOUNTER — Inpatient Hospital Stay (HOSPITAL_COMMUNITY)
Admission: EM | Admit: 2013-04-15 | Discharge: 2013-04-18 | DRG: 099 | Disposition: A | Discharge status: Home / Self Care | Payer: PRIVATE HEALTH INSURANCE | Attending: Neurosurgery | Admitting: Neurosurgery

## 2013-04-15 ENCOUNTER — Encounter (HOSPITAL_COMMUNITY): Payer: Self-pay | Admitting: Emergency Medicine

## 2013-04-15 DIAGNOSIS — K5900 Constipation, unspecified: Secondary | ICD-10-CM

## 2013-04-15 DIAGNOSIS — G039 Meningitis, unspecified: Principal | ICD-10-CM | POA: Diagnosis present

## 2013-04-15 DIAGNOSIS — R112 Nausea with vomiting, unspecified: Secondary | ICD-10-CM

## 2013-04-15 DIAGNOSIS — D72829 Elevated white blood cell count, unspecified: Secondary | ICD-10-CM

## 2013-04-15 DIAGNOSIS — R51 Headache: Secondary | ICD-10-CM

## 2013-04-15 DIAGNOSIS — R519 Headache, unspecified: Secondary | ICD-10-CM | POA: Diagnosis present

## 2013-04-15 DIAGNOSIS — H53149 Visual discomfort, unspecified: Secondary | ICD-10-CM

## 2013-04-15 LAB — CBC WITH DIFFERENTIAL/PLATELET
Basophils Absolute: 0 10*3/uL (ref 0.0–0.1)
Hemoglobin: 17.3 g/dL — ABNORMAL HIGH (ref 13.0–17.0)
Lymphocytes Relative: 14 % (ref 12–46)
Lymphs Abs: 4.6 10*3/uL — ABNORMAL HIGH (ref 0.7–4.0)
MCH: 31.8 pg (ref 26.0–34.0)
MCHC: 36.3 g/dL — ABNORMAL HIGH (ref 30.0–36.0)
MCV: 87.5 fL (ref 78.0–100.0)
Monocytes Absolute: 3 10*3/uL — ABNORMAL HIGH (ref 0.1–1.0)
Neutrophils Relative %: 76 % (ref 43–77)
Platelets: 268 10*3/uL (ref 150–400)
RDW: 12.8 % (ref 11.5–15.5)

## 2013-04-15 LAB — HEMOGLOBIN AND HEMATOCRIT, BLOOD
HCT: 38.7 % — ABNORMAL LOW (ref 39.0–52.0)
Hemoglobin: 13.6 g/dL (ref 13.0–17.0)

## 2013-04-15 LAB — COMPREHENSIVE METABOLIC PANEL
ALT: 11 U/L (ref 0–53)
AST: 14 U/L (ref 0–37)
Albumin: 3.6 g/dL (ref 3.5–5.2)
CO2: 29 mEq/L (ref 19–32)
Calcium: 9.5 mg/dL (ref 8.4–10.5)
Creatinine, Ser: 0.77 mg/dL (ref 0.50–1.35)
GFR calc non Af Amer: >90 mL/min (ref >90)
Sodium: 136 mEq/L (ref 135–145)
Total Bilirubin: 1.3 mg/dL — ABNORMAL HIGH (ref 0.3–1.2)
Total Protein: 7.4 g/dL (ref 6.0–8.3)

## 2013-04-15 LAB — GRAM STAIN: Special Requests: NORMAL

## 2013-04-15 LAB — TYPE AND SCREEN
ABO/RH(D): A POS
ABO/RH(D): A POS
Antibody Screen: NEGATIVE

## 2013-04-15 LAB — LIPASE, BLOOD: Lipase: 11 U/L (ref 11–59)

## 2013-04-15 MED ORDER — HYDROMORPHONE HCL PF 1 MG/ML IJ SOLN: 1.0000 mg | INTRAMUSCULAR | Status: DC | PRN

## 2013-04-15 MED ORDER — BISACODYL 10 MG RE SUPP
10.0000 mg | Freq: Two times a day (BID) | RECTAL | Status: DC
  Administered 2013-04-16 – 2013-04-17 (×3): 10 mg via RECTAL
  Filled 2013-04-15 (×3): qty 1

## 2013-04-15 MED ORDER — HYDROCODONE-ACETAMINOPHEN 5-325 MG PO TABS: 1.0000 | ORAL_TABLET | ORAL | Status: DC | PRN

## 2013-04-15 MED ORDER — ONDANSETRON HCL 4 MG/2ML IJ SOLN
4.0000 mg | Freq: Once | INTRAMUSCULAR | Status: AC
  Administered 2013-04-15: 4 mg via INTRAVENOUS
  Filled 2013-04-15: qty 2

## 2013-04-15 MED ORDER — OXYCODONE-ACETAMINOPHEN 5-325 MG PO TABS
1.0000 | ORAL_TABLET | ORAL | Status: DC | PRN
  Administered 2013-04-15 – 2013-04-16 (×2): 1 via ORAL
  Filled 2013-04-15 (×2): qty 1

## 2013-04-15 MED ORDER — PROMETHAZINE HCL 25 MG/ML IJ SOLN
25.0000 mg | Freq: Four times a day (QID) | INTRAMUSCULAR | Status: DC | PRN
  Filled 2013-04-15: qty 1

## 2013-04-15 MED ORDER — SODIUM CHLORIDE 0.9 % IV SOLN
8.0000 mg/h | INTRAVENOUS | Status: DC
  Administered 2013-04-15: 8 mg/h via INTRAVENOUS
  Filled 2013-04-15 (×2): qty 80

## 2013-04-15 MED ORDER — SODIUM CHLORIDE 0.9 % IV BOLUS (SEPSIS)
1000.0000 mL | Freq: Once | INTRAVENOUS | Status: AC
  Administered 2013-04-15: 1000 mL via INTRAVENOUS

## 2013-04-15 MED ORDER — MORPHINE SULFATE 2 MG/ML IJ SOLN
2.0000 mg | Freq: Once | INTRAMUSCULAR | Status: AC
  Administered 2013-04-15: 2 mg via INTRAVENOUS
  Filled 2013-04-15: qty 1

## 2013-04-15 MED ORDER — PROMETHAZINE HCL 25 MG PO TABS: 12.5000 mg | ORAL_TABLET | ORAL | Status: DC | PRN

## 2013-04-15 MED ORDER — ONDANSETRON HCL 4 MG PO TABS: 4.0000 mg | ORAL_TABLET | ORAL | Status: DC | PRN

## 2013-04-15 MED ORDER — SODIUM CHLORIDE 0.9 % IV SOLN: INTRAVENOUS | Status: DC

## 2013-04-15 MED ORDER — SODIUM CHLORIDE 0.9 % IV SOLN
INTRAVENOUS | Status: AC
  Administered 2013-04-15: 1000 mL via INTRAVENOUS
  Administered 2013-04-15: 18:00:00 via INTRAVENOUS

## 2013-04-15 MED ORDER — HEPARIN SODIUM (PORCINE) 5000 UNIT/ML IJ SOLN
5000.0000 [IU] | Freq: Three times a day (TID) | INTRAMUSCULAR | Status: DC
  Administered 2013-04-15 – 2013-04-18 (×8): 5000 [IU] via SUBCUTANEOUS
  Filled 2013-04-15 (×11): qty 1

## 2013-04-15 MED ORDER — DOCUSATE SODIUM 100 MG PO CAPS
100.0000 mg | ORAL_CAPSULE | Freq: Two times a day (BID) | ORAL | Status: DC
  Administered 2013-04-15 – 2013-04-18 (×6): 100 mg via ORAL
  Filled 2013-04-15 (×6): qty 1

## 2013-04-15 MED ORDER — GABAPENTIN 600 MG PO TABS
600.0000 mg | ORAL_TABLET | Freq: Three times a day (TID) | ORAL | Status: DC
  Administered 2013-04-15 – 2013-04-18 (×8): 600 mg via ORAL
  Filled 2013-04-15 (×11): qty 1

## 2013-04-15 MED ORDER — PANTOPRAZOLE SODIUM 40 MG IV SOLR: 40.0000 mg | Freq: Two times a day (BID) | INTRAVENOUS | Status: DC

## 2013-04-15 MED ORDER — POLYETHYLENE GLYCOL 3350 17 G PO PACK
17.0000 g | PACK | Freq: Two times a day (BID) | ORAL | Status: DC
  Administered 2013-04-15 – 2013-04-18 (×5): 17 g via ORAL
  Filled 2013-04-15 (×8): qty 1

## 2013-04-15 MED ORDER — HYDROMORPHONE HCL PF 1 MG/ML IJ SOLN: 1.0000 mg | Freq: Once | INTRAMUSCULAR | Status: DC

## 2013-04-15 MED ORDER — ONDANSETRON HCL 4 MG/2ML IJ SOLN: 4.0000 mg | Freq: Three times a day (TID) | INTRAMUSCULAR | Status: DC | PRN

## 2013-04-15 MED ORDER — ONDANSETRON HCL 4 MG/2ML IJ SOLN: 4.0000 mg | Freq: Four times a day (QID) | INTRAMUSCULAR | Status: DC | PRN

## 2013-04-15 MED ORDER — ONDANSETRON HCL 4 MG/2ML IJ SOLN
4.0000 mg | INTRAMUSCULAR | Status: DC | PRN
  Administered 2013-04-15: 4 mg via INTRAVENOUS
  Filled 2013-04-15: qty 2

## 2013-04-15 MED ORDER — LORAZEPAM 2 MG/ML IJ SOLN
1.0000 mg | Freq: Once | INTRAMUSCULAR | Status: AC
  Administered 2013-04-15: 1 mg via INTRAVENOUS
  Filled 2013-04-15: qty 1

## 2013-04-15 NOTE — H&P (Signed)
CC:  Chief Complaint  Patient presents with  . Nausea  . Emesis    HPI: Tony Richardson is a 47 y.o. male ~10 days s/p chiari decompression presenting to the ED today c/o 2 days of worsening HA, nausea and vomiting, and fever. He states at home today, he had a temperature of 101.0 F. He denies any urinary frequency, urgency, or dysuria. He states he has been relatively active at home until two days ago, and not staying in bed. He does c/o photophobia, although he says he did have some light sensitivity before the surgery. He has had neck stiffness since the surgery which is unchanged today. He denies back pain.  PMH: Past Medical History  Diagnosis Date  . Headache(784.0)     PSH: Past Surgical History  Procedure Laterality Date  . Thumb arthroscopy    . Suboccipital craniectomy cervical laminectomy N/A 04/07/2013    Procedure: SUBOCCIPITAL CRANIECTOMY CERVICAL LAMINECTOMY/DURAPLASTY RESECTION POSTERIOR CERVICAL ONE;  Surgeon: Karn Cassis, MD;  Location: [facility];  Service: Neurosurgery;  Laterality: N/A;    SH: History  Substance Use Topics  . Smoking status: Never Smoker   . Smokeless tobacco: Never Used  . Alcohol Use: Yes     Comment: occ    MEDS: Prior to Admission medications   Medication Sig Start Date End Date Taking? Authorizing Provider  gabapentin (NEURONTIN) 600 MG tablet Take 600 mg by mouth 3 (three) times daily.    Historical Provider, MD  sildenafil (VIAGRA) 100 MG tablet Take 100 mg by mouth daily as needed for erectile dysfunction.    Historical Provider, MD    ALLERGY: No Known Allergies  NEUROLOGIC EXAM: Awake, alert, oriented Memory and concentration grossly intact Speech fluent, appropriate CN grossly intact Normal ROM of neck (-) Kernig Motor exam: Upper Extremities Deltoid Bicep Tricep Grip  Right 5/5 5/5 5/5 5/5  Left 5/5 5/5 5/5 5/5   Lower Extremity IP Quad PF DF EHL  Right 5/5 5/5 5/5 5/5 5/5  Left 5/5 5/5 5/5 5/5 5/5    Sensation grossly intact to LT  IMGAING: Normal postoperative CT head  IMPRESSION: - 47 y.o. male postop Chiari with fever, leukocytosis. Should r/o postoperative infection  PLAN: - F/U blood cx - Check urine cx - CXR without infiltrate - Will do LP

## 2013-04-15 NOTE — Progress Notes (Signed)
LP performed at bedside. 3cc local given, 20G spinal needle accessed the L3-4 interspace by anatomic landmarks. Clear CSF obtained, totaling ~10cc. Sent for routine studies. Pt tolerated procedure well.

## 2013-04-15 NOTE — Progress Notes (Signed)
Report called on patient from Tiffany at @1630  and patient got to the floor at 1700. On arrival, patient looked stable, with incision at the back of head CDI. He was oriented to the room and made comfortable. MD paged on patient's arrival.

## 2013-04-15 NOTE — ED Notes (Signed)
Pt returned from CT and X-ray.

## 2013-04-15 NOTE — ED Provider Notes (Addendum)
CSN: [id number]     Arrival date & time 04/15/13  1405 History   First MD Initiated Contact with Patient 04/15/13 1406     Chief Complaint  Patient presents with  . Nausea  . Emesis   (Consider location/radiation/quality/duration/timing/severity/associated sxs/prior Treatment) HPI Patient presents after craniectomy, C1 arch revision that occurred 8 days ago.  Patient notes over the past days has developed increasing nausea, with persistent vomiting, and possible hematemesis. Patient denies confusion discharge patient does endorse headache, photosensitivity. No relief with home medication.  Past Medical History  Diagnosis Date  . WGNFAOZH(086.5)    Past Surgical History  Procedure Laterality Date  . Thumb arthroscopy    . Suboccipital craniectomy cervical laminectomy N/A 04/07/2013    Procedure: SUBOCCIPITAL CRANIECTOMY CERVICAL LAMINECTOMY/DURAPLASTY RESECTION POSTERIOR CERVICAL ONE;  Surgeon: Karn Cassis, MD;  Location: [facility];  Service: Neurosurgery;  Laterality: N/A;   No family history on file. History  Substance Use Topics  . Smoking status: Never Smoker   . Smokeless tobacco: Never Used  . Alcohol Use: Yes     Comment: occ    Review of Systems  Constitutional:       Per HPI, otherwise negative  HENT:       Per HPI, otherwise negative  Respiratory:       Per HPI, otherwise negative  Cardiovascular:       Per HPI, otherwise negative  Gastrointestinal: Positive for vomiting and constipation. Negative for abdominal pain.  Endocrine:       Negative aside from HPI  Genitourinary:       Neg aside from HPI   Musculoskeletal:       Per HPI, otherwise negative  Skin: Negative.   Neurological: Positive for headaches. Negative for seizures, syncope, facial asymmetry, speech difficulty, light-headedness and numbness.    Allergies  Review of patient's allergies indicates no known allergies.  Home Medications   Current Outpatient Rx  Name  Route  Sig   Dispense  Refill  . gabapentin (NEURONTIN) 600 MG tablet   Oral   Take 600 mg by mouth 3 (three) times daily.         . sildenafil (VIAGRA) 100 MG tablet   Oral   Take 100 mg by mouth daily as needed for erectile dysfunction.          BP 127/97  Pulse 66  Temp(Src) 97.4 F (36.3 C)  SpO2 99% Physical Exam  Nursing note and vitals reviewed. Constitutional: He is oriented to person, place, and time. He appears well-developed.  Uncomfortable appearing thin male  HENT:  Head: Normocephalic and atraumatic.  Eyes: Conjunctivae and EOM are normal. Right eye exhibits no discharge.  Neck: No tracheal deviation present.    Cardiovascular: Normal rate and regular rhythm.   Pulmonary/Chest: Effort normal. No stridor. No respiratory distress.  Abdominal: He exhibits no distension. There is no tenderness.  Musculoskeletal: He exhibits no edema.  Neurological: He is alert and oriented to person, place, and time. He displays no atrophy and no tremor. No cranial nerve deficit or sensory deficit. He exhibits normal muscle tone. He displays no seizure activity. Coordination normal.  Patient describes presents today, but eyes are equal, round, reactive, with appropriate eomi  Skin: Skin is warm. He is diaphoretic.  Psychiatric: He has a normal mood and affect.    ED Course  Procedures (including critical care time) Labs Review Labs Reviewed  CBC WITH DIFFERENTIAL - Abnormal; Notable for the following:    WBC  33.0 (*)    Hemoglobin 17.3 (*)    MCHC 36.3 (*)    Neutro Abs 25.1 (*)    Lymphs Abs 4.6 (*)    Monocytes Absolute 3.0 (*)    All other components within normal limits  COMPREHENSIVE METABOLIC PANEL - Abnormal; Notable for the following:    Chloride 93 (*)    Glucose, Bld 172 (*)    Total Bilirubin 1.3 (*)    All other components within normal limits  LIPASE, BLOOD   Imaging Review Dg Chest 2 View  04/15/2013   CLINICAL DATA:  Headache, nausea  EXAM: CHEST  2 VIEW   COMPARISON:  None.  FINDINGS: Cardiomediastinal silhouette is unremarkable. No acute infiltrate or pleural effusion. No pulmonary edema. Bony thorax is unremarkable.  IMPRESSION: No active cardiopulmonary disease.   Electronically Signed   By: Natasha Mead M.D.   On: 04/15/2013 15:01   Ct Head Wo Contrast  04/15/2013   CLINICAL DATA:  Headache, hypertension post surgery Budd-Chiari 1 malformation  EXAM: CT HEAD WITHOUT CONTRAST  TECHNIQUE: Contiguous axial images were obtained from the base of the skull through the vertex without intravenous contrast.  COMPARISON:  None.  FINDINGS: No skull fracture is noted. There is midline posterior occipital craniotomy. Small amount of soft tissue air at the surgical site is probable postsurgical in nature. Skin staples are noted midline posteriorly. No intracranial hemorrhage, mass effect or midline shift. No hydrocephalus. The gray and white-matter differentiation is preserved. No acute cortical infarction. No mass lesion is noted on this unenhanced scan.  IMPRESSION: Postsurgical changes with midline posterior occipital craniotomy. No intracranial hemorrhage, mass effect or midline shift. Small amount of soft tissue air at the surgery site probable postsurgical in nature. No hydrocephalus. No acute cortical infarction.   Electronically Signed   By: Natasha Mead M.D.   On: 04/15/2013 14:48    EKG Interpretation     Ventricular Rate:  77 PR Interval:  158 QRS Duration: 98 QT Interval:  383 QTC Calculation: 433 R Axis:   55 Text Interpretation:  Sinus rhythm Artifact Normal ECG          Update: I discussed the patient's case with our neurosurgical colleagues.  3:42 PM The patient's wife is now present, and she adds that the patient has had hematemesis  Update: I discussed the patient's case with our hospitalist team.  They will follow as a consulting service.  MDM  No diagnosis found. This patient presents after recent craniectomy, now with nausea,  vomiting, and possible hematemesis.  On exam he is afebrile but very uncomfortable appearing.  Patient's neck is mostly supple, though there is mild pain in the mid to surgical area.  Patient has a notable leukocytosis, and given the persistence of his symptoms, he was admitted for further evaluation and management. The patient received IV fluids, Protonix, analgesics, antiemetics in the emergency department with significant improvement in his condition.  The patient will be admitted to the neurosurgical service, with the hospitalist service consultation.    Gerhard Munch, MD 04/15/13 1554  Gerhard Munch, MD 04/15/13 [phone]

## 2013-04-15 NOTE — Progress Notes (Signed)
MD returning a page Saying he had just finished with a procedure so he will come to see patient.

## 2013-04-15 NOTE — ED Notes (Signed)
Pt is 8 days post of brain decompression surgery when he began developing headache, photosensitivity, and n/v. Pt was given 4mg  zofran en route. Pt was also hypertensive 140/100.

## 2013-04-15 NOTE — ED Notes (Signed)
Attempted to call report. Nurse to call back. 

## 2013-04-15 NOTE — Consult Note (Signed)
[facility] Consult Note                                                                                    Patient Demographics  Tony Richardson, is a 47 y.o. male   MRN: [id number]   DOB - 1965-09-23  Admit Date - 04/15/2013    Outpatient Primary MD for the patient is  Duane Lope, MD  Consult requested in the Hospital by Lisbeth Renshaw, MD, On 04/15/2013    Reason for consult nausea vomiting   With History of -  Past Medical History  Diagnosis Date  . [id number](478.2)       Past Surgical History  Procedure Laterality Date  . Thumb arthroscopy    . Suboccipital craniectomy cervical laminectomy N/A 04/07/2013    Procedure: SUBOCCIPITAL CRANIECTOMY CERVICAL LAMINECTOMY/DURAPLASTY RESECTION POSTERIOR CERVICAL ONE;  Surgeon: Karn Cassis, MD;  Location: [facility];  Service: Neurosurgery;  Laterality: N/A;    in for   Chief Complaint  Patient presents with  . Nausea  . Emesis     HPI  Tony Richardson  is a 47 y.o. male, who has no known medical problems except erectile dysfunction underwent suboccipital craniotomy with cervical laminectomy about one week ago by neurosurgery, he's been taking some scheduled pain medications since his surgery and has been mildly constipated since then, he was doing well until about yesterday morning when he started having gradually progressive generalized headache, this progressed to photophobia by last evening, he then started to have regular bouts of nausea and vomiting every 4-5 hours, he says he is hardly eaten anything since yesterday morning, vomitus is clear, family unsure if they've noticed some streaks of blood this afternoon, however no frank hematemesis, denies any black stools or blood in stool. No abdominal pain as such. He does say that his abdominal muscles are somewhat sore from constant nausea vomiting. Denies any previous history of GI complaints. Came to the ER where he was found to have a white blood cell count of 33,000, he  was found to have generalized headache and distress along with photophobia. Neurosurgery agreed to admit the patient and we are consulted for nausea vomiting.    Review of Systems    In addition to the HPI above,   No Fever-chills, ++ Headache is generalized along with photophobia, No changes with Vision or hearing, No problems swallowing food or Liquids, No Chest pain, Cough or Shortness of Breath, No Abdominal pain, is somewhat constipated and having some nausea vomiting No Blood in stool or Urine, No dysuria, No new skin rashes or bruises, No new joints pains-aches,  No new weakness, tingling, numbness in any extremity, No recent weight gain or loss, No polyuria, polydypsia or polyphagia, No significant Mental Stressors.  A full 10 point Review of Systems was done, except as stated above, all other Review of Systems were negative.   Social History History  Substance Use Topics  . Smoking status: Never Smoker   . Smokeless tobacco: Never Used  . Alcohol Use: Yes     Comment: occ      Family History History of irritable bowel syndrome and his father  Prior  to Admission medications   Medication Sig Start Date End Date Taking? Authorizing Provider  gabapentin (NEURONTIN) 600 MG tablet Take 600 mg by mouth 3 (three) times daily.   Yes Historical Provider, MD  sildenafil (VIAGRA) 100 MG tablet Take 100 mg by mouth daily as needed for erectile dysfunction.   Yes Historical Provider, MD    Anti-infectives   None      Scheduled Meds: . bisacodyl  10 mg Rectal BID  . docusate sodium  100 mg Oral BID  .  HYDROmorphone (DILAUDID) injection  1 mg Intravenous Once  . [START ON 04/18/2013] pantoprazole (PROTONIX) IV  40 mg Intravenous Q12H  . polyethylene glycol  17 g Oral BID   Continuous Infusions: . sodium chloride 75 mL/hr at 04/15/13 1759  . pantoprozole (PROTONIX) infusion 8 mg/hr (04/15/13 1553)   PRN Meds:.HYDROmorphone (DILAUDID) injection, ondansetron (ZOFRAN)  IV, promethazine  No Known Allergies  Physical Exam  Vitals  Blood pressure 118/75, pulse 78, temperature 98 F (36.7 C), temperature source Oral, resp. rate 20, SpO2 99.00%.   1. General Young Caucasian male lying in bed in mild to moderate distress due to headache and photophobia  2. Normal affect and insight, Not Suicidal or Homicidal, Awake Alert, Oriented X 3.  3. No F.N deficits, ALL C.Nerves Intact, Strength 5/5 all 4 extremities, Sensation intact all 4 extremities, Plantars down going.  4. Ears and Eyes appear Normal, Conjunctivae clear, PERRLA. Moist Oral Mucosa.  5. Supple Neck, No JVD, No cervical lymphadenopathy appriciated, No Carotid Bruits. Postsurgical scar in stable in the C-spine and occipital area appears stable  6. Symmetrical Chest wall movement, Good air movement bilaterally, CTAB.  7. RRR, No Gallops, Rubs or Murmurs, No Parasternal Heave.  8. Positive Bowel Sounds, Abdomen Soft, Non tender, No organomegaly appriciated,No rebound -guarding or rigidity.  9.  No Cyanosis, Normal Skin Turgor, No Skin Rash or Bruise.  10. Good muscle tone,  joints appear normal , no effusions, Normal ROM.  11. No Palpable Lymph Nodes in Neck or Axillae     Data Review  CBC  Recent Labs Lab 04/15/13 1436 04/15/13 1646  WBC 33.0*  --   HGB 17.3* 13.6  HCT 47.6 38.7*  PLT 268  --   MCV 87.5  --   MCH 31.8  --   MCHC 36.3*  --   RDW 12.8  --   LYMPHSABS 4.6*  --   MONOABS 3.0*  --   EOSABS 0.3  --   BASOSABS 0.0  --    ------------------------------------------------------------------------------------------------------------------  Chemistries   Recent Labs Lab 04/15/13 1436  NA 136  K 3.6  CL 93*  CO2 29  GLUCOSE 172*  BUN 14  CREATININE 0.77  CALCIUM 9.5  AST 14  ALT 11  ALKPHOS 74  BILITOT 1.3*   ------------------------------------------------------------------------------------------------------------------ CrCl is unknown because both  a height and weight (above a minimum accepted value) are required for this calculation. ------------------------------------------------------------------------------------------------------------------ No results found for this basename: TSH, T4TOTAL, FREET3, T3FREE, THYROIDAB,  in the last 72 hours   Coagulation profile No results found for this basename: INR, PROTIME,  in the last 168 hours ------------------------------------------------------------------------------------------------------------------- No results found for this basename: DDIMER,  in the last 72 hours -------------------------------------------------------------------------------------------------------------------  Cardiac Enzymes No results found for this basename: CK, CKMB, TROPONINI, MYOGLOBIN,  in the last 168 hours ------------------------------------------------------------------------------------------------------------------ No components found with this basename: POCBNP,    ---------------------------------------------------------------------------------------------------------------  Urinalysis No results found for this basename: colorurine,  appearanceur,  labspec,  phurine,  glucoseu,  hgbur,  bilirubinur,  ketonesur,  proteinur,  urobilinogen,  nitrite,  leukocytesur     Imaging results:   Dg Chest 2 View  04/15/2013   CLINICAL DATA:  Headache, nausea  EXAM: CHEST  2 VIEW  COMPARISON:  None.  FINDINGS: Cardiomediastinal silhouette is unremarkable. No acute infiltrate or pleural effusion. No pulmonary edema. Bony thorax is unremarkable.  IMPRESSION: No active cardiopulmonary disease.   Electronically Signed   By: Natasha Mead M.D.   On: 04/15/2013 15:01   Ct Head Wo Contrast  04/15/2013   CLINICAL DATA:  Headache, hypertension post surgery Budd-Chiari 1 malformation  EXAM: CT HEAD WITHOUT CONTRAST  TECHNIQUE: Contiguous axial images were obtained from the base of the skull through the vertex without  intravenous contrast.  COMPARISON:  None.  FINDINGS: No skull fracture is noted. There is midline posterior occipital craniotomy. Small amount of soft tissue air at the surgical site is probable postsurgical in nature. Skin staples are noted midline posteriorly. No intracranial hemorrhage, mass effect or midline shift. No hydrocephalus. The gray and white-matter differentiation is preserved. No acute cortical infarction. No mass lesion is noted on this unenhanced scan.  IMPRESSION: Postsurgical changes with midline posterior occipital craniotomy. No intracranial hemorrhage, mass effect or midline shift. Small amount of soft tissue air at the surgery site probable postsurgical in nature. No hydrocephalus. No acute cortical infarction.   Electronically Signed   By: Natasha Mead M.D.   On: 04/15/2013 14:48        Assessment & Plan   1. Post craniotomy/cervical laminectomy headache, autophobia, leukocytosis and nausea vomiting - concerning for CNS infection, neurosurgery to admit, will recommend LP, I have ordered blood cultures, will likely benefit from broad-spectrum antibiotics and ID consultation however will defer management of this problem to neuro surgery.     2. Nausea vomiting. Persistent, due to #1 above, will check KUB, Keep him n.p.o., IV fluids ordered, Zofran and Phenergan ordered, there was some question of some blood during one of his emesis episodes, likely Mallory-Weiss tear, IV PPI, type screen monitor H&H. If this prove of significant bleed GI will be consulted.    3. Constipation. Due to narcotics which he has been taking recently. Obtain KUB. Abdominal exam is benign. Doubt that his obstruction. We'll initiate bowel regimen.     DVT Prophylaxis  SCDs    AM Labs Ordered, also please review Full Orders  Family Communication: Plan discussed with patient and family   Thank you for the consult, we will follow the patient with you in the Hospital.   Susa Raring K M.D on  04/15/2013 at 6:30 PM  Between 7am to 7pm - Pager - [phone]  After 7pm go to [email or web address] - password [email or web address]  And look for the night coverage person covering me after hours   Thank you for the consult, we will follow the patient with you in the [facility].   [facility] Office  [phone]

## 2013-04-15 NOTE — H&P (Deleted)
[facility] Consult Note                                                                                    Patient Demographics  Tony Richardson, is a 47 y.o. male   MRN: [id number]   DOB - [birth year]  Admit Date - 04/15/2013    Outpatient Primary MD for the patient is  Duane Lope, MD  Consult requested in the Hospital by Gerhard Munch, MD, On 04/15/2013    Reason for consult nausea vomiting   With History of -  Past Medical History  Diagnosis Date  . [id number](308.6)       Past Surgical History  Procedure Laterality Date  . Thumb arthroscopy    . Suboccipital craniectomy cervical laminectomy N/A 04/07/2013    Procedure: SUBOCCIPITAL CRANIECTOMY CERVICAL LAMINECTOMY/DURAPLASTY RESECTION POSTERIOR CERVICAL ONE;  Surgeon: Karn Cassis, MD;  Location: [facility];  Service: Neurosurgery;  Laterality: N/A;    in for   Chief Complaint  Patient presents with  . Nausea  . Emesis     HPI  Tony Richardson  is a 47 y.o. male, who has no known medical problems except erectile dysfunction underwent suboccipital craniotomy with cervical laminectomy about one week ago by neurosurgery, he's been taking some scheduled pain medications since his surgery and has been mildly constipated since then, he was doing well until about yesterday morning when he started having gradually progressive generalized headache, this progressed to photophobia by last evening, he then started to have regular bouts of nausea and vomiting every 4-5 hours, he says he is hardly eaten anything since yesterday morning, vomitus is clear, family unsure if they've noticed some streaks of blood this afternoon, however no frank hematemesis, denies any black stools or blood in stool. No abdominal pain as such. He does say that his abdominal muscles are somewhat sore from constant nausea vomiting. Denies any previous history of GI complaints. Came to the ER where he was found to have a white blood cell count of 33,000, he was  found to have generalized headache and distress along with photophobia. Neurosurgery agreed to admit the patient and we are consulted for nausea vomiting.    Review of Systems    In addition to the HPI above,   No Fever-chills, ++ Headache is generalized along with photophobia, No changes with Vision or hearing, No problems swallowing food or Liquids, No Chest pain, Cough or Shortness of Breath, No Abdominal pain, is somewhat constipated and having some nausea vomiting No Blood in stool or Urine, No dysuria, No new skin rashes or bruises, No new joints pains-aches,  No new weakness, tingling, numbness in any extremity, No recent weight gain or loss, No polyuria, polydypsia or polyphagia, No significant Mental Stressors.  A full 10 point Review of Systems was done, except as stated above, all other Review of Systems were negative.   Social History History  Substance Use Topics  . Smoking status: Never Smoker   . Smokeless tobacco: Never Used  . Alcohol Use: Yes     Comment: occ      Family History History of irritable bowel syndrome and his father  Prior  to Admission medications   Medication Sig Start Date End Date Taking? Authorizing Provider  gabapentin (NEURONTIN) 600 MG tablet Take 600 mg by mouth 3 (three) times daily.   Yes Historical Provider, MD  sildenafil (VIAGRA) 100 MG tablet Take 100 mg by mouth daily as needed for erectile dysfunction.   Yes Historical Provider, MD    Anti-infectives   None      Scheduled Meds: . bisacodyl  10 mg Rectal BID  . docusate sodium  100 mg Oral BID  .  HYDROmorphone (DILAUDID) injection  1 mg Intravenous Once  . [START ON 04/19/2013] pantoprazole (PROTONIX) IV  40 mg Intravenous Q12H  . polyethylene glycol  17 g Oral BID   Continuous Infusions: . sodium chloride    . pantoprozole (PROTONIX) infusion 8 mg/hr (04/15/13 1553)  . sodium chloride 1,000 mL (04/15/13 1540)   PRN Meds:.HYDROmorphone (DILAUDID) injection,  ondansetron (ZOFRAN) IV, promethazine  No Known Allergies  Physical Exam  Vitals  Blood pressure 127/97, pulse 66, temperature 97.4 F (36.3 C), SpO2 99.00%.   1. General Young Caucasian male lying in bed in mild to moderate distress due to headache and photophobia  2. Normal affect and insight, Not Suicidal or Homicidal, Awake Alert, Oriented X 3.  3. No F.N deficits, ALL C.Nerves Intact, Strength 5/5 all 4 extremities, Sensation intact all 4 extremities, Plantars down going.  4. Ears and Eyes appear Normal, Conjunctivae clear, PERRLA. Moist Oral Mucosa.  5. Supple Neck, No JVD, No cervical lymphadenopathy appriciated, No Carotid Bruits. Postsurgical scar in stable in the C-spine and occipital area appears stable  6. Symmetrical Chest wall movement, Good air movement bilaterally, CTAB.  7. RRR, No Gallops, Rubs or Murmurs, No Parasternal Heave.  8. Positive Bowel Sounds, Abdomen Soft, Non tender, No organomegaly appriciated,No rebound -guarding or rigidity.  9.  No Cyanosis, Normal Skin Turgor, No Skin Rash or Bruise.  10. Good muscle tone,  joints appear normal , no effusions, Normal ROM.  11. No Palpable Lymph Nodes in Neck or Axillae     Data Review  CBC  Recent Labs Lab 04/15/13 1436  WBC 33.0*  HGB 17.3*  HCT 47.6  PLT 268  MCV 87.5  MCH 31.8  MCHC 36.3*  RDW 12.8  LYMPHSABS 4.6*  MONOABS 3.0*  EOSABS 0.3  BASOSABS 0.0   ------------------------------------------------------------------------------------------------------------------  Chemistries   Recent Labs Lab 04/15/13 1436  NA 136  K 3.6  CL 93*  CO2 29  GLUCOSE 172*  BUN 14  CREATININE 0.77  CALCIUM 9.5  AST 14  ALT 11  ALKPHOS 74  BILITOT 1.3*   ------------------------------------------------------------------------------------------------------------------ CrCl is unknown because both a height and weight (above a minimum accepted value) are required for this  calculation. ------------------------------------------------------------------------------------------------------------------ No results found for this basename: TSH, T4TOTAL, FREET3, T3FREE, THYROIDAB,  in the last 72 hours   Coagulation profile No results found for this basename: INR, PROTIME,  in the last 168 hours ------------------------------------------------------------------------------------------------------------------- No results found for this basename: DDIMER,  in the last 72 hours -------------------------------------------------------------------------------------------------------------------  Cardiac Enzymes No results found for this basename: CK, CKMB, TROPONINI, MYOGLOBIN,  in the last 168 hours ------------------------------------------------------------------------------------------------------------------ No components found with this basename: POCBNP,    ---------------------------------------------------------------------------------------------------------------  Urinalysis No results found for this basename: colorurine, appearanceur, labspec, phurine, glucoseu, hgbur, bilirubinur, ketonesur, proteinur, urobilinogen, nitrite, leukocytesur     Imaging results:   Dg Chest 2 View  04/15/2013   CLINICAL DATA:  Headache, nausea  EXAM: CHEST  2 VIEW  COMPARISON:  None.  FINDINGS: Cardiomediastinal silhouette is unremarkable. No acute infiltrate or pleural effusion. No pulmonary edema. Bony thorax is unremarkable.  IMPRESSION: No active cardiopulmonary disease.   Electronically Signed   By: Natasha Mead M.D.   On: 04/15/2013 15:01   Ct Head Wo Contrast  04/15/2013   CLINICAL DATA:  Headache, hypertension post surgery Budd-Chiari 1 malformation  EXAM: CT HEAD WITHOUT CONTRAST  TECHNIQUE: Contiguous axial images were obtained from the base of the skull through the vertex without intravenous contrast.  COMPARISON:  None.  FINDINGS: No skull fracture is noted. There is  midline posterior occipital craniotomy. Small amount of soft tissue air at the surgical site is probable postsurgical in nature. Skin staples are noted midline posteriorly. No intracranial hemorrhage, mass effect or midline shift. No hydrocephalus. The gray and white-matter differentiation is preserved. No acute cortical infarction. No mass lesion is noted on this unenhanced scan.  IMPRESSION: Postsurgical changes with midline posterior occipital craniotomy. No intracranial hemorrhage, mass effect or midline shift. Small amount of soft tissue air at the surgery site probable postsurgical in nature. No hydrocephalus. No acute cortical infarction.   Electronically Signed   By: Natasha Mead M.D.   On: 04/15/2013 14:48        Assessment & Plan   1. Post craniotomy/cervical laminectomy headache, autophobia, leukocytosis and nausea vomiting - concerning for CNS infection, neurosurgery to admit, will recommend LP, I have ordered blood cultures, will likely benefit from broad-spectrum antibiotics and ID consultation however will defer management of this problem to neuro surgery.     2. Nausea vomiting. Persistent, due to #1 above, will check KUB, Keep him n.p.o., IV fluids ordered, Zofran and Phenergan ordered, there was some question of some blood during one of his emesis episodes, likely Mallory-Weiss tear, IV PPI, type screen monitor H&H. If this prove of significant bleed GI will be consulted.    3. Constipation. Due to narcotics which he has been taking recently. Obtain KUB. Abdominal exam is benign. Doubt that his obstruction. We'll initiate bowel regimen.     DVT Prophylaxis  SCDs    AM Labs Ordered, also please review Full Orders  Family Communication: Plan discussed with patient and family   Thank you for the consult, we will follow the patient with you in the Hospital.   Susa Raring K M.D on 04/15/2013 at 4:01 PM  Between 7am to 7pm - Pager - [phone]  After 7pm go to  [email or web address] - password [email or web address]  And look for the night coverage person covering me after hours   Thank you for the consult, we will follow the patient with you in the [facility].   [facility] Office  [phone]

## 2013-04-16 LAB — CBC
HCT: 37.8 % — ABNORMAL LOW (ref 39.0–52.0)
Hemoglobin: 13.1 g/dL (ref 13.0–17.0)
MCHC: 34.7 g/dL (ref 30.0–36.0)
MCV: 87.5 fL (ref 78.0–100.0)
RDW: 12.8 % (ref 11.5–15.5)

## 2013-04-16 LAB — PROTEIN AND GLUCOSE, CSF
Glucose, CSF: 54 mg/dL (ref 43–76)
Total  Protein, CSF: 137 mg/dL — ABNORMAL HIGH (ref 15–45)

## 2013-04-16 LAB — CSF CELL COUNT WITH DIFFERENTIAL
Eosinophils, CSF: 0 % (ref 0–1)
Lymphs, CSF: 12 % — ABNORMAL LOW (ref 40–80)
Monocyte-Macrophage-Spinal Fluid: 9 % — ABNORMAL LOW (ref 15–45)
RBC Count, CSF: 0 /mm3
Segmented Neutrophils-CSF: 79 % — ABNORMAL HIGH (ref 0–6)
Tube #: 3
WBC, CSF: 330 /mm3 (ref 0–5)

## 2013-04-16 MED ORDER — OXYCODONE-ACETAMINOPHEN 5-325 MG PO TABS
1.0000 | ORAL_TABLET | ORAL | Status: DC | PRN
  Administered 2013-04-16 – 2013-04-18 (×7): 1 via ORAL
  Filled 2013-04-16 (×7): qty 1

## 2013-04-16 MED ORDER — PANTOPRAZOLE SODIUM 40 MG PO TBEC
40.0000 mg | DELAYED_RELEASE_TABLET | Freq: Two times a day (BID) | ORAL | Status: DC
  Administered 2013-04-16 – 2013-04-18 (×4): 40 mg via ORAL
  Filled 2013-04-16 (×4): qty 1

## 2013-04-16 NOTE — Progress Notes (Signed)
No issues overnight. Pt doing well, without significan c/o. States he feels better than yesterday.  EXAM:  BP 110/74  Pulse 95  Temp(Src) 98.4 F (36.9 C) (Oral)  Resp 20  Ht 5\' 8"  (1.727 m)  Wt 61.689 kg (136 lb)  BMI 20.68 kg/m2  SpO2 100%  Awake, alert, oriented  Speech fluent, appropriate  CN grossly intact  5/5 BUE/BLE   Blood, Urine, CSF cx neg so far CSF glu/pro reviewed, not suggestive of meningitis  IMPRESSION:  47 y.o. male with postop leukocytosis and fever, w/u negative so far  PLAN: - Cont to monitor, f/u cultures

## 2013-04-16 NOTE — Progress Notes (Addendum)
CRITICAL VALUE ALERT  Critical value received: CSF WBC= 330  Date of notification:  04/16/13  Time of notification:  0036  Critical value read back: yes  Nurse who received alert:  Maalle Starrett [place], RN  MD notified (1st page):  [facility]- Dr. Conchita Paris  Time of first page:  0037  MD notified (2nd page):  Time of second page:  Responding MD:  Dr. Conchita Paris  Time MD responded:  [phone]

## 2013-04-16 NOTE — Progress Notes (Signed)
UR Completed.  Gaile Allmon Jane [phone] 04/16/2013  

## 2013-04-16 NOTE — Progress Notes (Signed)
Patient ID: Tony Richardson, male   DOB: 1966/02/08, 47 y.o.   MRN: [id number] [facility] PROGRESS NOTE  [name] WUJ:[id number] DOB: 02-17-1966 DOA: 04/15/2013 PCP:  Duane Lope, MD  Brief narrative: 47 y.o. male, who has no known medical problems except erectile dysfunction, underwent suboccipital craniotomy with cervical laminectomy about one week prior to this admission by neurosurgery, he's been taking some scheduled pain medications since his surgery and has been mildly constipated since then, he was doing well until about one day PTA when he started having gradually progressive generalized headache, this progressed to photophobia, nausea, non bloody vomiting, and poor oral intake. He has denied abdominal pain, previous history of GI complaints.  Active Problems:   Headache with photophobia  - unclear etiology, neurosurgery following - will continue to follow up on recommendations - pt says percocet helps with symptoms and will continue as needed - LP done, results pending    Nausea & vomiting - possibly related to principal problem - pt reports anti emetics help with symptom control - will continue Zofran as needed and Phenergan for refractory nausea - if symptoms do not improve, will place eon scheduled Zofran    Constipation - continue bowel regimen   Leukocytosis - unclear etiology - now trending down - pt remains afebrile over 24 hours - LP analysis pending   Consultants:  [facility] consulting and neurosurgery is the primary team   Procedures/Studies: Dg Chest 2 View  04/15/2013    No active cardiopulmonary disease.    Dg Abd 1 View   04/15/2013   Normal bowel gas pattern. No acute abnormality.    Ct Head Wo Contrast   04/15/2013    Postsurgical changes with midline posterior occipital craniotomy. No intracranial hemorrhage, mass effect or midline shift. Small amount of soft tissue air at the surgery site probable postsurgical in nature. No hydrocephalus. No acute  cortical infarction.     Antibiotics:  None  Code Status: Full Family Communication: Pt and mother at bedside Disposition Plan: Home when medically stable  HPI/Subjective: No events overnight.   Objective: Filed Vitals:   04/15/13 2048 04/15/13 2150 04/16/13 0200 04/16/13 0600  BP:  105/75 160/61 125/77  Pulse:  81 75 79  Temp:   98.1 F (36.7 C) 97.6 F (36.4 C)  TempSrc:      Resp:  16 16 16   Height: 5\' 8"  (1.727 m)     Weight: 61.689 kg (136 lb)     SpO2:  97% 99% 100%    Intake/Output Summary (Last 24 hours) at 04/16/13 0850 Last data filed at 04/15/13 2100  Gross per 24 hour  Intake      0 ml  Output    200 ml  Net   -200 ml    Exam:   General:  Pt is alert, follows commands appropriately, not in acute distress  Cardiovascular: Regular rate and rhythm, S1/S2, no murmurs, no rubs, no gallops  Respiratory: Clear to auscultation bilaterally, no wheezing, no crackles, no rhonchi  Abdomen: Soft, non tender, non distended, bowel sounds present, no guarding  Extremities: No edema, pulses DP and PT palpable bilaterally  Neuro: Grossly nonfocal  Data Reviewed: Basic Metabolic Panel:  Recent Labs Lab 04/15/13 1436  NA 136  K 3.6  CL 93*  CO2 29  GLUCOSE 172*  BUN 14  CREATININE 0.77  CALCIUM 9.5   Liver Function Tests:  Recent Labs Lab 04/15/13 1436  AST 14  ALT 11  ALKPHOS 74  BILITOT 1.3*  PROT 7.4  ALBUMIN 3.6    Recent Labs Lab 04/15/13 1436  LIPASE 11   CBC:  Recent Labs Lab 04/15/13 1436 04/15/13 1646 04/16/13 0410  WBC 33.0*  --  16.3*  NEUTROABS 25.1*  --   --   HGB 17.3* 13.6 13.1  HCT 47.6 38.7* 37.8*  MCV 87.5  --  87.5  PLT 268  --  218    Recent Results (from the past 240 hour(s))  CULTURE, BLOOD (ROUTINE X 2)     Status: None   Collection Time    04/15/13  4:36 PM      Result Value Range Status   Specimen Description BLOOD RIGHT ARM   Final   Special Requests BOTTLES DRAWN AEROBIC AND ANAEROBIC B 10CC R  5CC   Final   Culture  Setup Time     Final   Value: 04/15/2013 22:08     Performed at [facility]   Culture     Final   Value:        BLOOD CULTURE RECEIVED NO GROWTH TO DATE CULTURE WILL BE HELD FOR 5 DAYS BEFORE ISSUING A FINAL NEGATIVE REPORT     Performed at [facility]   Report Status PENDING   Incomplete  CULTURE, BLOOD (ROUTINE X 2)     Status: None   Collection Time    04/15/13  4:45 PM      Result Value Range Status   Specimen Description BLOOD RIGHT HAND   Final   Special Requests BOTTLES DRAWN AEROBIC ONLY 10CC   Final   Culture  Setup Time     Final   Value: 04/15/2013 22:08     Performed at [facility]   Culture     Final   Value:        BLOOD CULTURE RECEIVED NO GROWTH TO DATE CULTURE WILL BE HELD FOR 5 DAYS BEFORE ISSUING A FINAL NEGATIVE REPORT     Performed at [facility]   Report Status PENDING   Incomplete  GRAM STAIN     Status: None   Collection Time    04/15/13 11:08 PM      Result Value Range Status   Specimen Description CSF   Final   Special Requests Normal   Final   Gram Stain     Final   Value: CYTOSPUN     WBC PRESENT,BOTH PMN AND MONONUCLEAR     NO ORGANISMS SEEN   Report Status 04/15/2013 FINAL   Final     Scheduled Meds: . bisacodyl  10 mg Rectal BID  . docusate sodium  100 mg Oral BID  . gabapentin  600 mg Oral TID  . heparin subcutaneous  5,000 Units Subcutaneous Q8H  . [START ON 04/18/2013] pantoprazole (PROTONIX) IV  40 mg Intravenous Q12H  . polyethylene glycol  17 g Oral BID   Continuous Infusions: . sodium chloride 1,000 mL (04/15/13 2045)  . sodium chloride      Debbora Presto, MD  [facility] Pager [phone]  If 7PM-7AM, please contact night-coverage [email or web address] Password [email or web address] 04/16/2013, 8:50 AM   LOS: 1 day

## 2013-04-17 LAB — BASIC METABOLIC PANEL
BUN: 6 mg/dL (ref 6–23)
CO2: 27 mEq/L (ref 19–32)
Chloride: 101 mEq/L (ref 96–112)
Glucose, Bld: 91 mg/dL (ref 70–99)
Potassium: 3.5 mEq/L (ref 3.5–5.1)
Sodium: 137 mEq/L (ref 135–145)

## 2013-04-17 LAB — CBC
HCT: 35.2 % — ABNORMAL LOW (ref 39.0–52.0)
Hemoglobin: 12.3 g/dL — ABNORMAL LOW (ref 13.0–17.0)
Platelets: 220 10*3/uL (ref 150–400)
RBC: 4.02 MIL/uL — ABNORMAL LOW (ref 4.22–5.81)
WBC: 9.4 10*3/uL (ref 4.0–10.5)

## 2013-04-17 LAB — URINE CULTURE: Colony Count: 9000

## 2013-04-17 MED ORDER — DEXAMETHASONE 4 MG PO TABS
4.0000 mg | ORAL_TABLET | Freq: Four times a day (QID) | ORAL | Status: DC
  Administered 2013-04-17 – 2013-04-18 (×4): 4 mg via ORAL
  Filled 2013-04-17 (×8): qty 1

## 2013-04-17 NOTE — Progress Notes (Signed)
Patient ID: Tony Richardson, male   DOB: [birth year], 47 y.o.   MRN: [id number] Wound dty, no increase of temp. No nausea. Csf, urine cultures are negative. He does feel better. Chemical meningitis?

## 2013-04-17 NOTE — Consult Note (Signed)
[facility] Medical Consultation  Tony Richardson ZOX:[id number] DOB: 05/09/1966 DOA: 04/15/2013 PCP:  Duane Lope, MD   Requesting physician: Lisbeth Renshaw, MD,  Date of consultation: 04/15/2013  Reason for consultation: nausea vomiting  Impression/Recommendations Active Problems:   Headache(784.0)   Photophobia   Nausea & vomiting   Constipation    Headache with photophobia  - unclear etiology, neurosurgery following; resolved - LP done, results below -Recommend discharging patient  Nausea & vomiting  - Resolved    Constipation  - continue bowel regimen   Leukocytosis  - Resolved  -Recommend discharging patient   I will followup again tomorrow. Please contact me if I can be of assistance in the meanwhile. Thank you for this consultation.  Chief Complaint: Headache, nausea, vomiting  HPI:  47 y.o. male12 days s/p chiari decompression , who has no known medical problems except erectile dysfunction, underwent suboccipital craniotomy with cervical laminectomy about one week prior to this admission by neurosurgery, he's been taking some scheduled pain medications since his surgery and has been mildly constipated since then, he was doing well until about one day PTA when he started having gradually progressive generalized headache, this progressed to photophobia, nausea, non bloody vomiting, and poor oral intake. He has denied abdominal pain, previous history of GI complaints.04/17/2013 ; patient has no complaints, eating his meal without nausea, vomiting, negative headache, negative loss of upper body strength     Procedure CSF 04/15/2013 WBC= 330(H), PMN=79%(H), Monocyte=9%(H) Culture; negative to date Gram stain;MN and mononuclear cells, negative organism (final) Total protein; 137 (H.)     Blood culture 04/15/2013; negative to date Urine Culture 04/15/2013 ; negative to date   Past Medical History  Diagnosis Date  . [id number](295.6)    Past Surgical  History  Procedure Laterality Date  . Thumb arthroscopy    . Suboccipital craniectomy cervical laminectomy N/A 04/07/2013    Procedure: SUBOCCIPITAL CRANIECTOMY CERVICAL LAMINECTOMY/DURAPLASTY RESECTION POSTERIOR CERVICAL ONE;  Surgeon: Karn Cassis, MD;  Location: [facility];  Service: Neurosurgery;  Laterality: N/A;   Social History:  reports that he has never smoked. He has never used smokeless tobacco. He reports that he drinks alcohol. He reports that he does not use illicit drugs.  No Known Allergies No family history on file.  Prior to Admission medications   Medication Sig Start Date End Date Taking? Authorizing Provider  gabapentin (NEURONTIN) 600 MG tablet Take 600 mg by mouth 3 (three) times daily.    Historical Provider, MD  sildenafil (VIAGRA) 100 MG tablet Take 100 mg by mouth daily as needed for erectile dysfunction.    Historical Provider, MD   Physical Exam: Blood pressure 119/76, pulse 72, temperature 97.3 F (36.3 C), temperature source Oral, resp. rate 18, height 5\' 8"  (1.727 m), weight 61.689 kg (136 lb), SpO2 100.00%. Filed Vitals:   04/17/13 0200 04/17/13 0600 04/17/13 1000 04/17/13 1405  BP:  122/81 119/75 119/76  Pulse:  73 109 72  Temp: 98.1 F (36.7 C) 98.4 F (36.9 C) 98.5 F (36.9 C) 97.3 F (36.3 C)  TempSrc:   Oral Oral  Resp:  16 18 18   Height:      Weight:      SpO2:  98% 98% 100%     General:  A./O. x4, NAD  Eyes: Pupils equal round reactive to light and accommodation  Neck:  Posterior neck has staple line in the cervical region consistent with recent surgery negative sign of infection  Cardiovascular: Regular in rate, negative  murmurs rubs gallops, DP/PT pulses 2+ bilateral  Respiratory: Clear to auscultation bilateral  Abdomen: Soft, nontender, nondistended, plus bowel sounds  Skin: Posterior cervical incision site clean, staples still present, negative sign of infection  Musculoskeletal: Negative pedal edema  Neurologic:  Cranial nerves II through XII intact, bilateral upper extremity/lower extremity strength 5/5, sensation intact throughout  Labs on Admission:  Basic Metabolic Panel:  Recent Labs Lab 04/15/13 1436 04/17/13 0537  NA 136 137  K 3.6 3.5  CL 93* 101  CO2 29 27  GLUCOSE 172* 91  BUN 14 6  CREATININE 0.77 0.71  CALCIUM 9.5 8.6   Liver Function Tests:  Recent Labs Lab 04/15/13 1436  AST 14  ALT 11  ALKPHOS 74  BILITOT 1.3*  PROT 7.4  ALBUMIN 3.6    Recent Labs Lab 04/15/13 1436  LIPASE 11   No results found for this basename: AMMONIA,  in the last 168 hours CBC:  Recent Labs Lab 04/15/13 1436 04/15/13 1646 04/16/13 0410 04/17/13 0537  WBC 33.0*  --  16.3* 9.4  NEUTROABS 25.1*  --   --   --   HGB 17.3* 13.6 13.1 12.3*  HCT 47.6 38.7* 37.8* 35.2*  MCV 87.5  --  87.5 87.6  PLT 268  --  218 220   Cardiac Enzymes: No results found for this basename: CKTOTAL, CKMB, CKMBINDEX, TROPONINI,  in the last 168 hours BNP: No components found with this basename: POCBNP,  CBG: No results found for this basename: GLUCAP,  in the last 168 hours  Radiological Exams on Admission: Dg Abd 1 View  04/15/2013   CLINICAL DATA:  Nausea and vomiting.  EXAM: ABDOMEN - 1 VIEW  COMPARISON:  None.  FINDINGS: There is no free air underneath the hemidiaphragms. The bowel gas pattern is nonobstructive. Stool and bowel gastric stone sub low a rectosigmoid. No dilated bowel or air-fluid levels.  IMPRESSION: Normal bowel gas pattern. No acute abnormality.   Electronically Signed   By: Andreas Newport M.D.   On: 04/15/2013 19:44    EKG: Independently reviewed.   Time spent: 35 minutes  Drema Dallas [facility] Pager [phone]  If 7PM-7AM, please contact night-coverage [email or web address] Password [email or web address] 04/17/2013, 3:26 PM

## 2013-04-18 DIAGNOSIS — D72829 Elevated white blood cell count, unspecified: Secondary | ICD-10-CM | POA: Diagnosis present

## 2013-04-18 NOTE — Discharge Summary (Signed)
Physician Discharge Summary  Patient ID: UZZIAH RIGG MRN: [id number] DOB/AGE: 1966/02/08 47 y.o.  Admit date: 04/15/2013 Discharge date: 04/18/2013  Admission Diagnoses:arnold chiari malformation. Chemical meningitis  Discharge Diagnoses: same Active Problems:   Headache(784.0)   Photophobia   Nausea & vomiting   Constipation   Discharged Condition:no pain , no fever  Hospital Course: observation  Consults: none  Significant Diagnostic Studies: ct head , csf, urine cultures  Treatments: observation  Discharge Exam: Blood pressure 118/77, pulse 76, temperature 97.6 F (36.4 C), temperature source Oral, resp. rate 18, height 5\' 8"  (1.727 m), weight 61.689 kg (136 lb), SpO2 98.00%. No fever. No headache.   Disposition:home. To see me in 6 days     Medication List    ASK your doctor about these medications       gabapentin 600 MG tablet  Commonly known as:  NEURONTIN  Take 600 mg by mouth 3 (three) times daily.     sildenafil 100 MG tablet  Commonly known as:  VIAGRA  Take 100 mg by mouth daily as needed for erectile dysfunction.         Signed: Karn Cassis 04/18/2013, 10:08 AM

## 2013-04-18 NOTE — Progress Notes (Signed)
Pt. DC home by car with family member.  DC instructions given to patient and understood.  Pt. Assessment was stable.

## 2013-04-20 LAB — CSF CULTURE W GRAM STAIN: Special Requests: NORMAL

## 2013-04-21 LAB — CULTURE, BLOOD (ROUTINE X 2)
Culture: NO GROWTH
Culture: NO GROWTH

## 2013-05-24 ENCOUNTER — Ambulatory Visit: Payer: PRIVATE HEALTH INSURANCE | Attending: Neurosurgery | Admitting: Physical Therapy

## 2013-05-24 DIAGNOSIS — M542 Cervicalgia: Secondary | ICD-10-CM | POA: Insufficient documentation

## 2013-05-24 DIAGNOSIS — IMO0001 Reserved for inherently not codable concepts without codable children: Secondary | ICD-10-CM | POA: Insufficient documentation

## 2013-05-24 DIAGNOSIS — R262 Difficulty in walking, not elsewhere classified: Secondary | ICD-10-CM | POA: Insufficient documentation

## 2013-05-29 ENCOUNTER — Ambulatory Visit: Payer: PRIVATE HEALTH INSURANCE | Admitting: Physical Therapy

## 2013-05-31 ENCOUNTER — Ambulatory Visit: Payer: PRIVATE HEALTH INSURANCE | Admitting: Physical Therapy

## 2013-06-02 ENCOUNTER — Ambulatory Visit: Payer: PRIVATE HEALTH INSURANCE | Admitting: Physical Therapy

## 2013-06-05 ENCOUNTER — Ambulatory Visit: Payer: PRIVATE HEALTH INSURANCE | Admitting: Physical Therapy

## 2013-06-07 ENCOUNTER — Ambulatory Visit: Payer: PRIVATE HEALTH INSURANCE | Admitting: Physical Therapy

## 2013-06-09 ENCOUNTER — Ambulatory Visit: Payer: PRIVATE HEALTH INSURANCE | Admitting: Physical Therapy

## 2013-06-12 ENCOUNTER — Ambulatory Visit: Payer: PRIVATE HEALTH INSURANCE | Admitting: Physical Therapy

## 2013-06-14 ENCOUNTER — Ambulatory Visit: Payer: PRIVATE HEALTH INSURANCE | Admitting: Physical Therapy

## 2013-06-19 ENCOUNTER — Ambulatory Visit: Payer: PRIVATE HEALTH INSURANCE | Admitting: Physical Therapy

## 2013-06-21 ENCOUNTER — Ambulatory Visit: Payer: PRIVATE HEALTH INSURANCE | Admitting: Physical Therapy

## 2013-06-23 ENCOUNTER — Ambulatory Visit: Payer: PRIVATE HEALTH INSURANCE | Attending: Neurosurgery | Admitting: Physical Therapy

## 2013-06-23 DIAGNOSIS — IMO0001 Reserved for inherently not codable concepts without codable children: Secondary | ICD-10-CM | POA: Insufficient documentation

## 2013-06-23 DIAGNOSIS — M542 Cervicalgia: Secondary | ICD-10-CM | POA: Insufficient documentation

## 2013-06-23 DIAGNOSIS — R262 Difficulty in walking, not elsewhere classified: Secondary | ICD-10-CM | POA: Insufficient documentation

## 2013-06-26 ENCOUNTER — Ambulatory Visit: Payer: PRIVATE HEALTH INSURANCE | Admitting: Physical Therapy

## 2013-06-28 ENCOUNTER — Ambulatory Visit: Payer: PRIVATE HEALTH INSURANCE | Admitting: Physical Therapy

## 2013-06-30 ENCOUNTER — Ambulatory Visit: Payer: PRIVATE HEALTH INSURANCE | Admitting: Physical Therapy

## 2014-03-07 ENCOUNTER — Other Ambulatory Visit (HOSPITAL_COMMUNITY): Payer: Self-pay | Admitting: Gastroenterology

## 2014-03-07 DIAGNOSIS — R1310 Dysphagia, unspecified: Secondary | ICD-10-CM

## 2014-03-15 ENCOUNTER — Ambulatory Visit (HOSPITAL_COMMUNITY)
Admission: RE | Admit: 2014-03-15 | Discharge: 2014-03-15 | Disposition: A | Discharge status: Home / Self Care | Payer: BC Managed Care – PPO | Source: Ambulatory Visit | Attending: Gastroenterology | Admitting: Gastroenterology

## 2014-03-15 DIAGNOSIS — R109 Unspecified abdominal pain: Secondary | ICD-10-CM | POA: Diagnosis not present

## 2014-03-15 DIAGNOSIS — R1310 Dysphagia, unspecified: Secondary | ICD-10-CM | POA: Insufficient documentation

## 2014-03-15 DIAGNOSIS — R51 Headache: Secondary | ICD-10-CM | POA: Diagnosis not present

## 2014-03-15 DIAGNOSIS — R1110 Vomiting, unspecified: Secondary | ICD-10-CM | POA: Diagnosis not present

## 2014-03-15 NOTE — Procedures (Signed)
Supervised and reviewed by Endiya Klahr MA CCC-SLP  

## 2014-03-15 NOTE — Procedures (Signed)
Objective Swallowing Evaluation: Modified Barium Swallowing Study  Patient Details  Name: Tony Richardson MRN: [id number] Date of Birth: August 09, 1965  Today's Date: 03/15/2014 Time: 1109-1130 SLP Time Calculation (min): 21 min  Past Medical History:  Past Medical History  Diagnosis Date  . EHUDJSHF(026.3)    Past Surgical History:  Past Surgical History  Procedure Laterality Date  . Thumb arthroscopy    . Suboccipital craniectomy cervical laminectomy N/A 04/07/2013    Procedure: SUBOCCIPITAL CRANIECTOMY CERVICAL LAMINECTOMY/DURAPLASTY RESECTION POSTERIOR CERVICAL ONE;  Surgeon: Floyce Stakes, MD;  Location: [place] [facility];  Service: Neurosurgery;  Laterality: N/A;   HPI:  Pt is a 48 y.o. male with a past medical history of chiari decompression who underwent suboccipital craniotomy with cervical laminectomy. He presents today as an outpatient with complains of regurgitation and esophageal/stomach pain when drinking liquids. He reports that solid foods cause no issues, but when drinking more than a single sip of liquid it "gets stuck" then comes back up causing pain and shortness of breath. This has been ongoing for the past seven years and has recently become a more frequent occurrence. He reports a variable frequency ranging from once per week to once per month.    Assessment / Plan / Recommendation Clinical Impression  Dysphagia Diagnosis: Within Functional Limits Clinical impression: Pt demonstrates normal oral and oropharyngeal swallow function, although signs of a possible esophageal dysphagia were noted. When taking consecutive sips rather than single sips, what appears to be backflow from the lower esophageal sphincter to the distal esophagus was seen (no radiologist present to confirm). These contents were regurgitated with pain in the esophagus and stomach reported by the patient. SLP was unable to complete trials of solids after this episode due to nausea and irritation. Consider follow  up with a gastroenterologist for further evaluation (esophogram/barium swallow). Recommend continued use of the strategies already in use(single sips).     Treatment Recommendation  No treatment recommended at this time    Diet Recommendation Regular;Thin liquid        Other  Recommendations Recommended Consults: Consider GI evaluation;Consider esophageal assessment   Follow Up Recommendations       Frequency and Duration        Pertinent Vitals/Pain Pt with discomfort during exam, limited activity to fit tolerance    SLP Swallow Goals     General HPI: Pt is a 48 y.o. male with a past medical history of chiari decompression and underwent suboccipital craniotomy with cervical laminectomy. He presents today as an outpaient with complains of regurgitation and esophageal/stomach pain when drinking liquids. He reports that solid foods caause no issues, but when drinknig more than a single sip of liquid it "gets stuck" then comes back up causing pain and shortness of breath. Type of Study: Modified Barium Swallowing Study Reason for Referral: Objectively evaluate swallowing function Diet Prior to this Study: Regular;Thin liquids Respiratory Status: Room air History of Recent Intubation: No Behavior/Cognition: Alert;Cooperative;Pleasant mood Oral Cavity - Dentition: Adequate natural dentition Oral Motor / Sensory Function: Within functional limits Self-Feeding Abilities: Able to feed self Patient Positioning: Upright in chair Baseline Vocal Quality: Clear Volitional Cough: Strong Volitional Swallow: Able to elicit Anatomy: Within functional limits    Reason for Referral Objectively evaluate swallowing function   Oral Phase Oral Preparation/Oral Phase Oral Phase: WFL   Pharyngeal Phase Pharyngeal Phase Pharyngeal Phase: Within functional limits  Cervical Esophageal Phase    GO    Cervical Esophageal Phase Cervical Esophageal Phase: WFL (see impression  statement)          Eden Emms 03/15/2014, 2:23 PM

## 2014-03-15 NOTE — Progress Notes (Signed)
03/15/14 1300  SLP G-Codes **NOT FOR INPATIENT CLASS**  Functional Assessment Tool Used clinical judgement  Functional Limitations Swallowing  Swallow Current Status (L4562) CI  Swallow Goal Status (B6389) CI  Swallow Discharge Status (H7342) CI  SLP Evaluations  $ SLP Speech Visit 1 Procedure  SLP Evaluations  $Swallowing Treatment 1 Procedure  $MBS Swallow Outpatient 1 Procedure

## 2015-02-04 ENCOUNTER — Other Ambulatory Visit: Payer: Self-pay | Admitting: Neurosurgery

## 2015-02-04 DIAGNOSIS — M47812 Spondylosis without myelopathy or radiculopathy, cervical region: Secondary | ICD-10-CM

## 2015-02-13 ENCOUNTER — Ambulatory Visit
Admission: RE | Admit: 2015-02-13 | Discharge: 2015-02-13 | Disposition: A | Discharge status: Home / Self Care | Payer: Medicaid Other | Source: Ambulatory Visit | Attending: Neurosurgery | Admitting: Neurosurgery

## 2015-02-13 DIAGNOSIS — M47812 Spondylosis without myelopathy or radiculopathy, cervical region: Secondary | ICD-10-CM

## 2015-02-13 MED ORDER — ONDANSETRON HCL 4 MG/2ML IJ SOLN: 4.0000 mg | Freq: Four times a day (QID) | INTRAMUSCULAR | Status: DC | PRN

## 2015-02-13 MED ORDER — IOHEXOL 300 MG/ML  SOLN
10.0000 mL | Freq: Once | INTRAMUSCULAR | Status: DC | PRN
  Administered 2015-02-13: 10 mL via INTRATHECAL

## 2015-02-13 MED ORDER — HYDROMORPHONE HCL 1 MG/ML IJ SOLN
1.0000 mg | Freq: Once | INTRAMUSCULAR | Status: AC
  Administered 2015-02-13: 1 mg via INTRAMUSCULAR

## 2015-02-13 MED ORDER — ONDANSETRON HCL 4 MG/2ML IJ SOLN
4.0000 mg | Freq: Once | INTRAMUSCULAR | Status: AC
  Administered 2015-02-13: 4 mg via INTRAMUSCULAR

## 2015-02-13 MED ORDER — DIAZEPAM 5 MG PO TABS
10.0000 mg | ORAL_TABLET | Freq: Once | ORAL | Status: AC
  Administered 2015-02-13: 10 mg via ORAL

## 2015-02-13 NOTE — Discharge Instructions (Signed)

## 2015-02-20 ENCOUNTER — Other Ambulatory Visit: Payer: Self-pay | Admitting: Neurosurgery

## 2015-02-20 DIAGNOSIS — N289 Disorder of kidney and ureter, unspecified: Secondary | ICD-10-CM

## 2015-02-22 ENCOUNTER — Other Ambulatory Visit: Payer: Self-pay | Admitting: Neurosurgery

## 2015-02-22 DIAGNOSIS — N289 Disorder of kidney and ureter, unspecified: Secondary | ICD-10-CM

## 2015-02-27 ENCOUNTER — Other Ambulatory Visit: Payer: Medicaid Other

## 2015-03-01 ENCOUNTER — Ambulatory Visit
Admission: RE | Admit: 2015-03-01 | Discharge: 2015-03-01 | Disposition: A | Discharge status: Home / Self Care | Payer: Medicaid Other | Source: Ambulatory Visit | Attending: Neurosurgery | Admitting: Neurosurgery

## 2015-03-01 DIAGNOSIS — N289 Disorder of kidney and ureter, unspecified: Secondary | ICD-10-CM

## 2015-03-01 MED ORDER — IOPAMIDOL (ISOVUE-370) INJECTION 76%
75.0000 mL | Freq: Once | INTRAVENOUS | Status: AC | PRN
  Administered 2015-03-01: 75 mL via INTRAVENOUS

## 2015-03-04 ENCOUNTER — Other Ambulatory Visit: Payer: Medicaid Other

## 2015-03-04 ENCOUNTER — Other Ambulatory Visit (HOSPITAL_COMMUNITY): Payer: Self-pay | Admitting: Urology

## 2015-03-04 DIAGNOSIS — N289 Disorder of kidney and ureter, unspecified: Secondary | ICD-10-CM

## 2015-03-11 ENCOUNTER — Other Ambulatory Visit (HOSPITAL_COMMUNITY): Payer: Self-pay | Admitting: Urology

## 2015-03-11 DIAGNOSIS — N289 Disorder of kidney and ureter, unspecified: Secondary | ICD-10-CM

## 2015-03-14 ENCOUNTER — Other Ambulatory Visit: Payer: Self-pay | Admitting: Radiology

## 2015-03-14 ENCOUNTER — Ambulatory Visit (HOSPITAL_COMMUNITY): Admission: RE | Admit: 2015-03-14 | Payer: Medicaid Other | Source: Ambulatory Visit

## 2015-03-15 ENCOUNTER — Ambulatory Visit (HOSPITAL_COMMUNITY)
Admission: RE | Admit: 2015-03-15 | Discharge: 2015-03-15 | Disposition: A | Discharge status: Home / Self Care | Payer: Medicaid Other | Source: Ambulatory Visit | Attending: Urology | Admitting: Urology

## 2015-03-15 ENCOUNTER — Encounter (HOSPITAL_COMMUNITY): Payer: Self-pay

## 2015-03-15 DIAGNOSIS — N289 Disorder of kidney and ureter, unspecified: Secondary | ICD-10-CM | POA: Diagnosis not present

## 2015-03-15 DIAGNOSIS — R200 Anesthesia of skin: Secondary | ICD-10-CM | POA: Diagnosis not present

## 2015-03-15 LAB — CBC
HEMATOCRIT: 44.6 % (ref 39.0–52.0)
HEMOGLOBIN: 14.8 g/dL (ref 13.0–17.0)
MCH: 29.7 pg (ref 26.0–34.0)
MCHC: 33.2 g/dL (ref 30.0–36.0)
MCV: 89.6 fL (ref 78.0–100.0)
Platelets: 165 10*3/uL (ref 150–400)
RBC: 4.98 MIL/uL (ref 4.22–5.81)
RDW: 13.7 % (ref 11.5–15.5)
WBC: 10.1 10*3/uL (ref 4.0–10.5)

## 2015-03-15 LAB — PROTIME-INR
INR: 1.01 (ref 0.00–1.49)
Prothrombin Time: 13.5 seconds (ref 11.6–15.2)

## 2015-03-15 LAB — APTT: aPTT: 32 seconds (ref 24–37)

## 2015-03-15 MED ORDER — MIDAZOLAM HCL 2 MG/2ML IJ SOLN
INTRAMUSCULAR | Status: AC | PRN
  Administered 2015-03-15: 1 mg via INTRAVENOUS
  Administered 2015-03-15 (×2): 0.5 mg via INTRAVENOUS

## 2015-03-15 MED ORDER — FENTANYL CITRATE (PF) 100 MCG/2ML IJ SOLN
INTRAMUSCULAR | Status: AC
  Filled 2015-03-15: qty 2

## 2015-03-15 MED ORDER — MIDAZOLAM HCL 2 MG/2ML IJ SOLN
INTRAMUSCULAR | Status: AC
  Filled 2015-03-15: qty 4

## 2015-03-15 MED ORDER — SODIUM CHLORIDE 0.9 % IV SOLN
INTRAVENOUS | Status: DC
  Administered 2015-03-15: 09:00:00 via INTRAVENOUS

## 2015-03-15 MED ORDER — FENTANYL CITRATE (PF) 100 MCG/2ML IJ SOLN
INTRAMUSCULAR | Status: AC | PRN
  Administered 2015-03-15: 25 ug via INTRAVENOUS
  Administered 2015-03-15: 50 ug via INTRAVENOUS
  Administered 2015-03-15: 25 ug via INTRAVENOUS

## 2015-03-15 NOTE — Procedures (Signed)
Technically successful CT guided biopsy of partially exophytic lesion arising from the posterior inferior aspect of the right kidney.  No immediate post procedural complications.   Ronny Bacon, MD Pager #: [phone]

## 2015-03-15 NOTE — H&P (Signed)
Chief Complaint: Patient was seen in consultation today for image guided biopsy of right renal mass at the request of [place]  Referring Physician(s): Ottelin,Tycho  History of Present Illness: Tony Richardson is a 49 y.o. male who is otherwise healthy is here today for an image guided biopsy of a right renal mass  This was an incidental finding on lumbar spine CT which was done for lumbar radiculopathy and low back pain.  The most suspicious right-sided lesion is in the posterior inter/lower pole. Measures 1.3 cm   His only complaint today is some numbness of his right leg.  He denies any recent fever, chills, or illnesses.    He is NPO and has not taken any blood thinning medications.   Past Medical History  Diagnosis Date  . GYJEHUDJ(497.0)     Past Surgical History  Procedure Laterality Date  . Thumb arthroscopy    . Suboccipital craniectomy cervical laminectomy N/A 04/07/2013    Procedure: SUBOCCIPITAL CRANIECTOMY CERVICAL LAMINECTOMY/DURAPLASTY RESECTION POSTERIOR CERVICAL ONE;  Surgeon: Floyce Stakes, MD;  Location: [facility];  Service: Neurosurgery;  Laterality: N/A;    Allergies: Other  Medications: Prior to Admission medications   Medication Sig Start Date End Date Taking? Authorizing Provider  HYDROcodone-acetaminophen (NORCO) 10-325 MG per tablet Take 1 tablet by mouth 3 (three) times daily as needed for moderate pain.   Yes Historical Provider, MD  gabapentin (NEURONTIN) 600 MG tablet Take 600 mg by mouth 3 (three) times daily.    Historical Provider, MD  sildenafil (VIAGRA) 100 MG tablet Take 100 mg by mouth daily as needed for erectile dysfunction.    Historical Provider, MD     History reviewed. No pertinent family history.  Social History   Social History  . Marital Status: Married    Spouse Name: N/A  . Number of Children: N/A  . Years of Education: N/A   Social History Main Topics  . Smoking status: Never Smoker   . Smokeless  tobacco: Never Used  . Alcohol Use: Yes     Comment: occ  . Drug Use: No  . Sexual Activity: Not Asked   Other Topics Concern  . None   Social History Narrative    Review of Systems  Constitutional: Negative for fever, chills, activity change, appetite change and fatigue.  HENT: Negative.   Respiratory: Negative for cough, chest tightness and shortness of breath.   Cardiovascular: Negative for chest pain.  Gastrointestinal: Negative for nausea, vomiting, abdominal pain and abdominal distention.  Genitourinary: Negative.   Musculoskeletal: Positive for back pain.  Skin: Negative.   Neurological: Positive for numbness. Negative for syncope.  Psychiatric/Behavioral: Negative.     Vital Signs: 156/94 Pulse 72 Afebrile   Physical Exam  Constitutional: He is oriented to person, place, and time. He appears well-developed and well-nourished.  HENT:  Head: Normocephalic and atraumatic.  Eyes: EOM are normal.  Neck: Normal range of motion. Neck supple.  Cardiovascular: Normal rate, regular rhythm and normal heart sounds.   No murmur heard. Pulmonary/Chest: Effort normal. No respiratory distress. He has no wheezes.  Abdominal: Soft. Bowel sounds are normal. He exhibits no distension. There is no tenderness.  Musculoskeletal: Normal range of motion.  Neurological: He is alert and oriented to person, place, and time.  Skin: Skin is warm and dry.  Psychiatric: He has a normal mood and affect. His behavior is normal. Judgment and thought content normal.    Mallampati Score:  MD Evaluation Airway: WNL Heart:  WNL Abdomen: WNL Chest/ Lungs: WNL ASA  Classification: 2 Mallampati/Airway Score: One  Imaging: Ct Cervical Spine W Contrast  02/13/2015   CLINICAL DATA:  Cervical and lumbar spondylosis. Cervicalgia/neck pain with cervical and lumbar radiculopathy. Lumbago/low back pain.  FLUOROSCOPY TIME:  1 minutes 22 seconds  Twenty-one fluoroscopic spot films were obtained.   PROCEDURE: LUMBAR PUNCTURE FOR CERVICAL AND LUMBAR MYELOGRAM  CERVICAL AND LUMBAR MYELOGRAM  CT CERVICAL MYELOGRAM  CT LUMBAR MYELOGRAM  After thorough discussion of risks and benefits of the procedure including bleeding, infection, injury to nerves, blood vessels, adjacent structures as well as headache and CSF leak, written and oral informed consent was obtained. Consent was obtained by Dr. Dereck Ligas.  Patient was positioned prone on the fluoroscopy table. Local anesthesia was provided with 1% lidocaine without epinephrine after prepped and draped in the usual sterile fashion. Puncture was performed at L2-L3 using a 3 1/2 inch 22 gauge atraumatic (Sprotte) spinal needle via RIGHT paramedian approach. Using a single pass through the dura, the needle was placed within the thecal sac, with return of clear CSF. 10 mL of Omnipaque-300 was injected into the thecal sac, with normal opacification of the nerve roots and cauda equina consistent with free flow within the subarachnoid space. The patient was then moved to the trendelenburg position and contrast flowed into the Cervical spine region.  I personally performed the lumbar puncture and administered the intrathecal contrast. I also personally supervised acquisition of the myelogram images.  TECHNIQUE: Contiguous axial images were obtained through the Cervical and Lumbar spine after the intrathecal infusion of infusion. Coronal and sagittal reconstructions were obtained of the axial image sets.  FINDINGS: CERVICAL AND LUMBAR MYELOGRAM FINDINGS:  Cervical spinal alignment in the neutral position is near anatomic with 2 mm of retrolisthesis of C5 on C6. With flexion, the spondylolisthesis corrects anatomic alignment. Flexion range of motion is excellent. Extension range of motion is also a good and with extension, C5 on C6 2 mm retrolisthesis is similar to the neutral position. The patient also develops 2 mm of retrolisthesis of C4 on C5 with extension.  Lumbar  spinal alignment on the standing upright frontal shows a mild levoconvex curve with the apex at L4. Grade I anterolisthesis of L4 on L5 is present that measures 6 mm. Flexion range of motion is adequate and anterolisthesis persists at 6 mm. With extension, anterolisthesis remains at 6 mm. The other levels do not show significant spondylolisthesis. Degenerative disc disease is present with anterior extradural impression and disc space narrowing at L4-L5. Other disc levels are normal. There is bilateral subarticular stenosis at L4-L5, greater on the RIGHT when compared to the LEFT.  CT CERVICAL MYELOGRAM FINDINGS:  Alignment: Slight reversal of the normal cervical lordosis.  Vertebrae: No aggressive osseous lesions. Mild degenerative endplate changes at W4-X3.  Cord: Normal caliber. No swelling to suggest an intramedullary lesion or cord edema.  Posterior Fossa: Suboccipital craniectomy.  Vertebral Arteries: Grossly normal.  Paraspinal tissues: Normal.  Disc levels:  C1-C2: Resection of the posterior arch of C1 atlantodental degenerative disease. No malalignment or evidence of instability  C2-C3: LEFT eccentric shallow disc osteophyte complex with mild narrowing of the ventral subarachnoid space. LEFT uncovertebral spurring producing LEFT foraminal stenosis that potentially affects the LEFT C3 nerve. LEFT facet arthrosis contributes to LEFT foraminal stenosis. The RIGHT foramen is patent.  C3-C4: Shallow disc osteophyte complex. No cord deformity. Mild bilateral uncovertebral spurring, slightly greater on the RIGHT when compared to the LEFT. Mild symmetric  foraminal stenosis secondary to uncovertebral spurring.  C4-C5:  Negative.  C5-C6: Disc degeneration and loss of height with broad-based disc osteophyte complex. Mild central stenosis. There is RIGHT-greater-than-LEFT foraminal stenosis associated with uncovertebral spurring.  C6-C7: Disc degeneration and loss of height. Shallow broad-based disc osteophyte complex.  LEFT foraminal stenosis secondary to lateral osteophyte and uncovertebral spurring. This potentially affects the LEFT C7 nerve. RIGHT neural foraminal encroachment is also present although less severe. Mild central stenosis associated with broad-based disc bulging.  C7-T1:  Negative.  CT LUMBAR MYELOGRAM FINDINGS:  Segmentation: The numbering convention used for this exam termed L5-S1 as the last intervertebral disc space.  Alignment: Persistent anterolisthesis of L4 on L5 measuring 5 mm. Difference in measurement is due to changes in technique and position.  Vertebrae: No aggressive osseous lesions or fracture. Osteophytes extending off the anterior aspect of L5. Bilateral L4 pars defects account for the anterolisthesis. These appear chronic.  Conus medullaris: Normal termination at L1.  Paraspinal tissues: There is a peculiar configuration of the LEFT kidney. Lobulated contour is present in the medial interpolar region of the LEFT kidney (image 18 series 12). This is Iso attenuating to renal parenchyma. A renal mass cannot be excluded. Follow-up evaluation is recommended.  There is a low-attenuation cyst measuring about 1 cm adjacent to the contour abnormality however this does not appear to produce the contour abnormality. RIGHT common iliac artery atherosclerosis. Bilateral sacroiliac joint osteoarthritis is mild and expected for age.  Disc levels:  T12-L1:  Negative.  L1-L2:  Negative.  L2-L3:  Mild LEFT facet arthrosis. No resulting stenosis.  L3-L4: Mild LEFT facet arthrosis with a small subchondral cyst in the inferior LEFT L3 articular process. No resulting stenosis.  L4-L5: Chronic bilateral pars defects. Severe disc degeneration with loss of height. Disc collapse is eccentric to the RIGHT. There is broad-based posterior disc bulging and uncoverage of the disc with moderate multifactorial central and bilateral subarticular stenosis. Severe RIGHT foraminal stenosis is present with moderate LEFT foraminal  stenosis. This is multifactorial.  L5-S1:  Negative.  IMPRESSION: 1. Technically successful lumbar puncture for cervical and lumbar myelogram. 2. Mild instability at C4-C5 and C5-C6 with 2 mm of retrolisthesis at each level accentuated by extension positioning. 3. Mild to moderate multilevel cervical spine degenerative disease detailed above. The most severe stenosis is on the LEFT at C6-C7, potentially affecting the LEFT C7 nerve. More mild foraminal stenosis at other levels. 4. Abnormal contour of the interpolar LEFT kidney. Lobular appearance of the medial cortex of the interpolar LEFT kidney suggesting a mass lesion. Followup renal MRI with and without contrast is recommended for further assessment. These results will be called to the ordering clinician or representative by the Radiologist Assistant, and communication documented in the PACS or zVision Dashboard. 5. Chronic bilateral L4 pars defects with grade I anterolisthesis of L4 on L5. Moderate central stenosis with bilateral subarticular stenosis and RIGHT-greater-than-LEFT foraminal stenosis. Disc degeneration and degenerated pars defect pseudoarthrosis.   Electronically Signed   By: Dereck Ligas M.D.   On: 02/13/2015 12:28   Ct Lumbar Spine W Contrast  02/13/2015   CLINICAL DATA:  Cervical and lumbar spondylosis. Cervicalgia/neck pain with cervical and lumbar radiculopathy. Lumbago/low back pain.  FLUOROSCOPY TIME:  1 minutes 22 seconds  Twenty-one fluoroscopic spot films were obtained.  PROCEDURE: LUMBAR PUNCTURE FOR CERVICAL AND LUMBAR MYELOGRAM  CERVICAL AND LUMBAR MYELOGRAM  CT CERVICAL MYELOGRAM  CT LUMBAR MYELOGRAM  After thorough discussion of risks and benefits of the procedure  including bleeding, infection, injury to nerves, blood vessels, adjacent structures as well as headache and CSF leak, written and oral informed consent was obtained. Consent was obtained by Dr. Dereck Ligas.  Patient was positioned prone on the fluoroscopy table.  Local anesthesia was provided with 1% lidocaine without epinephrine after prepped and draped in the usual sterile fashion. Puncture was performed at L2-L3 using a 3 1/2 inch 22 gauge atraumatic (Sprotte) spinal needle via RIGHT paramedian approach. Using a single pass through the dura, the needle was placed within the thecal sac, with return of clear CSF. 10 mL of Omnipaque-300 was injected into the thecal sac, with normal opacification of the nerve roots and cauda equina consistent with free flow within the subarachnoid space. The patient was then moved to the trendelenburg position and contrast flowed into the Cervical spine region.  I personally performed the lumbar puncture and administered the intrathecal contrast. I also personally supervised acquisition of the myelogram images.  TECHNIQUE: Contiguous axial images were obtained through the Cervical and Lumbar spine after the intrathecal infusion of infusion. Coronal and sagittal reconstructions were obtained of the axial image sets.  FINDINGS: CERVICAL AND LUMBAR MYELOGRAM FINDINGS:  Cervical spinal alignment in the neutral position is near anatomic with 2 mm of retrolisthesis of C5 on C6. With flexion, the spondylolisthesis corrects anatomic alignment. Flexion range of motion is excellent. Extension range of motion is also a good and with extension, C5 on C6 2 mm retrolisthesis is similar to the neutral position. The patient also develops 2 mm of retrolisthesis of C4 on C5 with extension.  Lumbar spinal alignment on the standing upright frontal shows a mild levoconvex curve with the apex at L4. Grade I anterolisthesis of L4 on L5 is present that measures 6 mm. Flexion range of motion is adequate and anterolisthesis persists at 6 mm. With extension, anterolisthesis remains at 6 mm. The other levels do not show significant spondylolisthesis. Degenerative disc disease is present with anterior extradural impression and disc space narrowing at L4-L5. Other disc  levels are normal. There is bilateral subarticular stenosis at L4-L5, greater on the RIGHT when compared to the LEFT.  CT CERVICAL MYELOGRAM FINDINGS:  Alignment: Slight reversal of the normal cervical lordosis.  Vertebrae: No aggressive osseous lesions. Mild degenerative endplate changes at N8-G9.  Cord: Normal caliber. No swelling to suggest an intramedullary lesion or cord edema.  Posterior Fossa: Suboccipital craniectomy.  Vertebral Arteries: Grossly normal.  Paraspinal tissues: Normal.  Disc levels:  C1-C2: Resection of the posterior arch of C1 atlantodental degenerative disease. No malalignment or evidence of instability  C2-C3: LEFT eccentric shallow disc osteophyte complex with mild narrowing of the ventral subarachnoid space. LEFT uncovertebral spurring producing LEFT foraminal stenosis that potentially affects the LEFT C3 nerve. LEFT facet arthrosis contributes to LEFT foraminal stenosis. The RIGHT foramen is patent.  C3-C4: Shallow disc osteophyte complex. No cord deformity. Mild bilateral uncovertebral spurring, slightly greater on the RIGHT when compared to the LEFT. Mild symmetric foraminal stenosis secondary to uncovertebral spurring.  C4-C5:  Negative.  C5-C6: Disc degeneration and loss of height with broad-based disc osteophyte complex. Mild central stenosis. There is RIGHT-greater-than-LEFT foraminal stenosis associated with uncovertebral spurring.  C6-C7: Disc degeneration and loss of height. Shallow broad-based disc osteophyte complex. LEFT foraminal stenosis secondary to lateral osteophyte and uncovertebral spurring. This potentially affects the LEFT C7 nerve. RIGHT neural foraminal encroachment is also present although less severe. Mild central stenosis associated with broad-based disc bulging.  C7-T1:  Negative.  CT  LUMBAR MYELOGRAM FINDINGS:  Segmentation: The numbering convention used for this exam termed L5-S1 as the last intervertebral disc space.  Alignment: Persistent anterolisthesis of  L4 on L5 measuring 5 mm. Difference in measurement is due to changes in technique and position.  Vertebrae: No aggressive osseous lesions or fracture. Osteophytes extending off the anterior aspect of L5. Bilateral L4 pars defects account for the anterolisthesis. These appear chronic.  Conus medullaris: Normal termination at L1.  Paraspinal tissues: There is a peculiar configuration of the LEFT kidney. Lobulated contour is present in the medial interpolar region of the LEFT kidney (image 18 series 12). This is Iso attenuating to renal parenchyma. A renal mass cannot be excluded. Follow-up evaluation is recommended.  There is a low-attenuation cyst measuring about 1 cm adjacent to the contour abnormality however this does not appear to produce the contour abnormality. RIGHT common iliac artery atherosclerosis. Bilateral sacroiliac joint osteoarthritis is mild and expected for age.  Disc levels:  T12-L1:  Negative.  L1-L2:  Negative.  L2-L3:  Mild LEFT facet arthrosis. No resulting stenosis.  L3-L4: Mild LEFT facet arthrosis with a small subchondral cyst in the inferior LEFT L3 articular process. No resulting stenosis.  L4-L5: Chronic bilateral pars defects. Severe disc degeneration with loss of height. Disc collapse is eccentric to the RIGHT. There is broad-based posterior disc bulging and uncoverage of the disc with moderate multifactorial central and bilateral subarticular stenosis. Severe RIGHT foraminal stenosis is present with moderate LEFT foraminal stenosis. This is multifactorial.  L5-S1:  Negative.  IMPRESSION: 1. Technically successful lumbar puncture for cervical and lumbar myelogram. 2. Mild instability at C4-C5 and C5-C6 with 2 mm of retrolisthesis at each level accentuated by extension positioning. 3. Mild to moderate multilevel cervical spine degenerative disease detailed above. The most severe stenosis is on the LEFT at C6-C7, potentially affecting the LEFT C7 nerve. More mild foraminal stenosis at  other levels. 4. Abnormal contour of the interpolar LEFT kidney. Lobular appearance of the medial cortex of the interpolar LEFT kidney suggesting a mass lesion. Followup renal MRI with and without contrast is recommended for further assessment. These results will be called to the ordering clinician or representative by the Radiologist Assistant, and communication documented in the PACS or zVision Dashboard. 5. Chronic bilateral L4 pars defects with grade I anterolisthesis of L4 on L5. Moderate central stenosis with bilateral subarticular stenosis and RIGHT-greater-than-LEFT foraminal stenosis. Disc degeneration and degenerated pars defect pseudoarthrosis.   Electronically Signed   By: Dereck Ligas M.D.   On: 02/13/2015 12:28   Ct Abd Wo & W Cm  03/01/2015   CLINICAL DATA:  Left renal mass suggested on lumbar spine CT.  EXAM: CT ABDOMEN WITHOUT AND WITH CONTRAST  TECHNIQUE: Multidetector CT imaging of the abdomen was performed following the standard protocol before and following the bolus administration of intravenous contrast.  CONTRAST:  75 cc of Isovue 370  COMPARISON:  Lumbar spine CT of 02/13/2015. Report of an abdominal pelvic CT of 11/23/2000.  FINDINGS: Lower chest: subsegmental atelectasis at both lung bases. Normal heart size, without pericardial effusion.  Hepatobiliary: Vague hyper enhancement involving the tip of the right lobe of the liver on image 61 of series 2. This is only apparent on arterial phase imaging. Normal gallbladder, without biliary ductal dilatation.  Pancreas: Normal, without mass or ductal dilatation.  Spleen: Normal in size, without focal abnormality.  Adrenals/Urinary Tract: Normal adrenal glands. No renal calculi or hydronephrosis.  Multiple bilateral renal lesions. Some are simple cysts.  Others are favored to represent complex cysts.  The most suspicious right-sided lesion is in the posterior inter/ lower pole. Measures 1.3 cm on image 67 of series 6. Demonstrates  heterogeneous enhancement, including on image 66 of series 5.  Complex lesion or adjacent lesions are identified in the anterior aspect of the upper pole left kidney. Within the lateral portion of this area, a focus of enhancing tissue is identified measuring 1.1 cm on image 48 of series 6.  Stomach/Bowel: Normal stomach, without wall thickening. Normal abdominal large and small bowel loops.  Vascular/Lymphatic: Celiac are narrowing on image 37 of series 5. Prominent pancreaticoduodenal arcades are likely secondary. Example image 46 series 5. Mild aortic atherosclerosis. Patent renal veins. No retroperitoneal or retrocrural adenopathy.  Other: No ascites.  Musculoskeletal: Lumbar spondylosis, as detailed on the dedicated CT.  IMPRESSION: 1. bilateral renal lesions, many of which are felt to represent cysts and complex cysts. However, there are enhancing lesions in both kidneys, highly suspicious for renal cell carcinoma. Given the number of lesions and apparent complexity, further evaluation with pre and post contrast abdominal MRI should be considered. Alternatively, urology consultation could be performed. 2. No renal vein involvement or typical findings of metastatic disease. 3. Focus of inferior right hepatic lobe arterial hyper enhancement is likely a perfusion anomaly but warrants followup attention. 4. Mild but age advanced atherosclerosis. Narrowing of the celiac origin is due to arcuate ligament compression. This is hemodynamically significant, given enlargement of pancreatic/duodenal arcades. These results will be called to the ordering clinician or representative by the Radiologist Assistant, and communication documented in the PACS or zVision Dashboard.   Electronically Signed   By: Abigail Miyamoto M.D.   On: 03/01/2015 11:00    Labs:  CBC:  Recent Labs  03/15/15 0908  WBC 10.1  HGB 14.8  HCT 44.6  PLT 165    COAGS:  Recent Labs  03/15/15 0908  INR 1.01  APTT 32    BMP: No results  for input(s): NA, K, CL, CO2, GLUCOSE, BUN, CALCIUM, CREATININE, GFRNONAA, GFRAA in the last 8760 hours.  Invalid input(s): CMP  LIVER FUNCTION TESTS: No results for input(s): BILITOT, AST, ALT, ALKPHOS, PROT, ALBUMIN in the last 8760 hours.  TUMOR MARKERS: No results for input(s): AFPTM, CEA, CA199, CHROMGRNA in the last 8760 hours.  Assessment and Plan:  Bilateral renal masses.  Right most suspicious.  Will proceed with image guided biopsy of right renal mass today by Dr. Pascal Lux.  Risks and Benefits discussed with the patient including, but not limited to bleeding, infection, damage to adjacent structures or low yield requiring additional tests. All of the patient's questions were answered, patient is agreeable to proceed. Consent signed and in chart.   Thank you for this interesting consult.  I greatly enjoyed meeting Tony Richardson and look forward to participating in their care.  A copy of this report was sent to the requesting provider on this date.  Signed: Murrell Redden PA-C 03/15/2015, 10:21 AM   I spent a total of  30 Minutes   in face to face in clinical consultation, greater than 50% of which was counseling/coordinating care for CT guided biopsy of right renal mass.

## 2015-03-15 NOTE — Discharge Instructions (Signed)
Kidney Biopsy, Care After °Refer to this sheet in the next few weeks. These instructions provide you with information on caring for yourself after your procedure. Your health care provider may also give you more specific instructions. Your treatment has been planned according to current medical practices, but problems sometimes occur. Call your health care provider if you have any problems or questions after your procedure.  °WHAT TO EXPECT AFTER THE PROCEDURE  °· You may notice blood in the urine for the first 24 hours after the biopsy. °· You may feel some pain at the biopsy site for 1-2 weeks after the biopsy. °HOME CARE INSTRUCTIONS °· Do not lift anything heavier than 10 lb (4.5 kg) for 2 weeks. °· Do not take any non-steroidal anti-inflammatory drugs (NSAIDs) or any blood thinners for a week after the biopsy unless instructed to do so by your health care provider. °· Only take medicines for pain, fever, or discomfort as directed by your health care provider. °SEEK MEDICAL CARE IF: °· You have bloody urine more than 24 hours after the biopsy.   °· You develop a fever.   °· You cannot urinate.   °· You have increasing pain at the biopsy site.   °SEEK IMMEDIATE MEDICAL CARE IF: °You feel faint or dizzy.  °Document Released: 02/08/2013 Document Reviewed: 02/08/2013 °ExitCare® Patient Information ©2015 [employer]. This information is not intended to replace advice given to you by your health care provider. Make sure you discuss any questions you have with your health care provider. °Kidney Biopsy °A biopsy is a test that involves collecting small pieces of tissue, usually with a needle. The tissue is then examined under a microscope. A kidney biopsy can help a health care provider make a diagnosis and determine the best course of treatment. Your health care provider may recommend a kidney biopsy if you have any of the following conditions: °· Blood in your urine (hematuria). °· Excessive protein in your urine  (proteinuria). °· Impaired kidney function that causes excessive waste products in your blood. °A specialist will look at the kidney tissue samples to check for unusual deposits, scarring, or infecting organisms that would explain your condition. If you have a kidney transplant, a biopsy can also help explain why a transplanted kidney is not working properly. Talk with your health care provider about what information might be learned from the biopsy and the risks involved. This can help you make a decision about whether a biopsy is worthwhile in your case. °LET YOUR HEALTH CARE PROVIDER KNOW ABOUT: °· Any allergies you have. °· All medicines you are taking, including vitamins, herbs, eye drops, creams, and over-the-counter medicines. °· Previous problems you or members of your family have had with the use of anesthetics. °· Any blood disorders you have. °· Previous surgeries you have had. °· Medical conditions you have. °RISKS AND COMPLICATIONS °Generally, a kidney biopsy is a safe procedure. However, as with any procedure, complications can occur. Possible complications include: °· Infection. °· Bleeding. °BEFORE THE PROCEDURE °· Make sure you understand the need for a biopsy. °· Do not eat or drink for 8 hours before the test or as directed by your health care provider. °· You will need to give blood and urine samples before the biopsy. This is to make sure you do not have a condition where you should not have a biopsy. °PROCEDURE °Kidney biopsies are usually done in a hospital. During the procedure, you may be fully awake with light sedation, or you may be asleep under   general anesthesia. The entire procedure usually takes an hour. °· You will lie on your stomach to position the kidneys near the surface of your back. If you have a transplanted kidney, you will lie on your back. °· The health care provider will inject a local painkiller. For a through-the-skin (percutaneous) biopsy, the health care provider will  use a locating needle and X-ray or ultrasound equipment to find the right spot. °· A collecting needle will be used to gather the tissue. If you are awake, you will be asked to hold your breath as the needle is inserted and collects the tissue. Each insertion and collection lasts about 30 seconds or a little longer. You will be told when to exhale. °AFTER THE PROCEDURE °· You will lie on your back for 12 to 24 hours. If you have a transplanted kidney, you may not have to lie on your back. During this time, your back will probably feel sore. You may stay in the hospital overnight after the procedure so that staff can check your condition. °· You may notice some blood in your urine for 24 hours after the test. To detect any problems, your health care providers will: °¨ Monitor your blood pressure and pulse. °¨ Take blood samples to measure the amount of red blood cells. °¨ Examine the urine that you pass. °· On rare occasions when bleeding is excessive, it may be necessary to replace lost blood with a transfusion. °· It is your responsibility to obtain your test results. Ask the lab or department performing the test when and how you will get your results. °FOR MORE INFORMATION °· [employer]: www.akfinc.org °· National Kidney Foundation: www.kidney.org °· National Kidney and Urologic Diseases Information Clearinghouse: http://kidney.niddk.nih.gov °Document Released: 04/18/2004 Document Revised: 03/29/2013 Document Reviewed: 12/12/2012 °ExitCare® Patient Information ©2015 [employer]. This information is not intended to replace advice given to you by your health care provider. Make sure you discuss any questions you have with your health care provider. °Conscious Sedation °Sedation is the use of medicines to promote relaxation and relieve discomfort and anxiety. Conscious sedation is a type of sedation. Under conscious sedation you are less alert than normal but are still able to respond to instructions or  stimulation. Conscious sedation is used during short medical and dental procedures. It is milder than deep sedation or general anesthesia and allows you to return to your regular activities sooner.  °LET YOUR HEALTH CARE PROVIDER KNOW ABOUT:  °· Any allergies you have. °· All medicines you are taking, including vitamins, herbs, eye drops, creams, and over-the-counter medicines. °· Use of steroids (by mouth or creams). °· Previous problems you or members of your family have had with the use of anesthetics. °· Any blood disorders you have. °· Previous surgeries you have had. °· Medical conditions you have. °· Possibility of pregnancy, if this applies. °· Use of cigarettes, alcohol, or illegal drugs. °RISKS AND COMPLICATIONS °Generally, this is a safe procedure. However, as with any procedure, problems can occur. Possible problems include: °· Oversedation. °· Trouble breathing on your own. You may need to have a breathing tube until you are awake and breathing on your own. °· Allergic reaction to any of the medicines used for the procedure. °BEFORE THE PROCEDURE °· You may have blood tests done. These tests can help show how well your kidneys and liver are working. They can also show how well your blood clots. °· A physical exam will be done.   °· Only take medicines as directed   by your health care provider. You may need to stop taking medicines (such as blood thinners, aspirin, or nonsteroidal anti-inflammatory drugs) before the procedure.   °· Do not eat or drink at least 6 hours before the procedure or as directed by your health care provider. °· Arrange for a responsible adult, family member, or friend to take you home after the procedure. He or she should stay with you for at least 24 hours after the procedure, until the medicine has worn off. °PROCEDURE  °· An intravenous (IV) catheter will be inserted into one of your veins. Medicine will be able to flow directly into your body through this catheter. You may be  given medicine through this tube to help prevent pain and help you relax. °· The medical or dental procedure will be done. °AFTER THE PROCEDURE °· You will stay in a recovery area until the medicine has worn off. Your blood pressure and pulse will be checked.   °·  Depending on the procedure you had, you may be allowed to go home when you can tolerate liquids and your pain is under control. °Document Released: 03/03/2001 Document Revised: 06/13/2013 Document Reviewed: 02/13/2013 °ExitCare® Patient Information ©2015 [employer]. This information is not intended to replace advice given to you by your health care provider. Make sure you discuss any questions you have with your health care provider. °Conscious Sedation, Adult, Care After °Refer to this sheet in the next few weeks. These instructions provide you with information on caring for yourself after your procedure. Your health care provider may also give you more specific instructions. Your treatment has been planned according to current medical practices, but problems sometimes occur. Call your health care provider if you have any problems or questions after your procedure. °WHAT TO EXPECT AFTER THE PROCEDURE  °After your procedure: °· You may feel sleepy, clumsy, and have poor balance for several hours. °· Vomiting may occur if you eat too soon after the procedure. °HOME CARE INSTRUCTIONS °· Do not participate in any activities where you could become injured for at least 24 hours. Do not: °¨ Drive. °¨ Swim. °¨ Ride a bicycle. °¨ Operate heavy machinery. °¨ Cook. °¨ Use power tools. °¨ Climb ladders. °¨ Work from a high place. °· Do not make important decisions or sign legal documents until you are improved. °· If you vomit, drink water, juice, or soup when you can drink without vomiting. Make sure you have little or no nausea before eating solid foods. °· Only take over-the-counter or prescription medicines for pain, discomfort, or fever as directed by your  health care provider. °· Make sure you and your family fully understand everything about the medicines given to you, including what side effects may occur. °· You should not drink alcohol, take sleeping pills, or take medicines that cause drowsiness for at least 24 hours. °· If you smoke, do not smoke without supervision. °· If you are feeling better, you may resume normal activities 24 hours after you were sedated. °· Keep all appointments with your health care provider. °SEEK MEDICAL CARE IF: °· Your skin is pale or bluish in color. °· You continue to feel nauseous or vomit. °· Your pain is getting worse and is not helped by medicine. °· You have bleeding or swelling. °· You are still sleepy or feeling clumsy after 24 hours. °SEEK IMMEDIATE MEDICAL CARE IF: °· You develop a rash. °· You have difficulty breathing. °· You develop any type of allergic problem. °· You have a fever. °  MAKE SURE YOU: °· Understand these instructions. °· Will watch your condition. °· Will get help right away if you are not doing well or get worse. °Document Released: 03/29/2013 Document Reviewed: 03/29/2013 °ExitCare® Patient Information ©2015 [employer], [employer]. This information is not intended to replace advice given to you by your health care provider. Make sure you discuss any questions you have with your health care provider. ° °

## 2015-04-19 ENCOUNTER — Encounter: Payer: Self-pay | Admitting: Neurology

## 2015-04-19 ENCOUNTER — Ambulatory Visit (INDEPENDENT_AMBULATORY_CARE_PROVIDER_SITE_OTHER): Payer: Medicaid Other | Admitting: Neurology

## 2015-04-19 VITALS — BP 151/102 | HR 72 | Ht 68.0 in | Wt 151.0 lb

## 2015-04-19 DIAGNOSIS — R208 Other disturbances of skin sensation: Secondary | ICD-10-CM | POA: Diagnosis not present

## 2015-04-19 DIAGNOSIS — Q0700 Arnold-Chiari syndrome without spina bifida or hydrocephalus: Secondary | ICD-10-CM

## 2015-04-19 DIAGNOSIS — R200 Anesthesia of skin: Secondary | ICD-10-CM | POA: Insufficient documentation

## 2015-04-19 HISTORY — DX: Arnold-Chiari syndrome without spina bifida or hydrocephalus: Q07.00

## 2015-04-19 HISTORY — DX: Anesthesia of skin: R20.0

## 2015-04-19 NOTE — Progress Notes (Signed)
Reason for visit: Facial numbness  Referring physician: Dr. Chanetta Marshall is a 49 y.o. male  History of present illness:  Mr. Deakins is a 49 year old right-handed white male with a history of Arnold-Chiari malformation, status post suboccipital craniectomy. The patient indicates that prior to surgery, he was reporting some facial numbness in the lower face and upper neck area anteriorly. This has not improved following surgery. The patient continues to have reports of tingling sensations around the lips, particularly with the lower lip, occasionally affecting the gums and tongue as well. The patient reports some dysphagia issues but he has a sensation of even liquids sticking in the his throat, he has had and endoscopic evaluation, no strictures were noted in the esophagus. He reports some posterior headaches as well with a soreness sensation. He has had some episodes of right hand numbness at times. The patient also reports some low back pain, some discomfort down the right leg, and numbness in both legs. He feels slightly weak in the right leg. He denies any significant gait issues, or difficulty controlling the bowels or the bladder. The patient indicates that he has never had MRI evaluation of the brain, only of the cervical spine. He was seen previously by Dr. Joya Salm. He indicates that the sensory symptoms may undulate in severity quite a bit, but he never feels completely normal. He believes that his symptoms have gradually worsened over time. He is sent to this office for further evaluation.  Past Medical History  Diagnosis Date  . Headache(784.0)   . Asthma   . Facial numbness 04/19/2015  . Arnold-Chiari deformity ([place]) 04/19/2015    Past Surgical History  Procedure Laterality Date  . Thumb arthroscopy    . Suboccipital craniectomy cervical laminectomy N/A 04/07/2013    Procedure: SUBOCCIPITAL CRANIECTOMY CERVICAL LAMINECTOMY/DURAPLASTY RESECTION POSTERIOR CERVICAL ONE;   Surgeon: Floyce Stakes, MD;  Location: [facility];  Service: Neurosurgery;  Laterality: N/A;  . Craniectomy suboccipital w/ cervical laminectomy / chiari      Family History  Problem Relation Age of Onset  . Hypertension Brother   . Colon cancer Maternal Aunt   . Colon cancer Paternal Aunt     Social history:  reports that he has never smoked. He has never used smokeless tobacco. He reports that he drinks about 0.6 oz of alcohol per week. He reports that he does not use illicit drugs.  Medications:  Prior to Admission medications   Medication Sig Start Date End Date Taking? Authorizing Provider  gabapentin (NEURONTIN) 300 MG capsule Take 300 mg by mouth daily.   Yes Historical Provider, MD  HYDROcodone-acetaminophen (NORCO) 10-325 MG per tablet Take 1 tablet by mouth 3 (three) times daily as needed for moderate pain.   Yes Historical Provider, MD  sildenafil (VIAGRA) 100 MG tablet Take 100 mg by mouth daily as needed for erectile dysfunction.   Yes Historical Provider, MD      Allergies  Allergen Reactions  . Other Other (See Comments)    Per Pt needs to avoid steroid products due to his eyes.     ROS:  Out of a complete 14 system review of symptoms, the patient complains only of the following symptoms, and all other reviewed systems are negative.  Fatigue Ringing in the ears, difficulty swallowing Blurred vision Cough, snoring Urination problems Feeling cold, flushing Achy muscles Headache, numbness, weakness, difficulty swallowing Depression, anxiety, not enough sleep, decreased energy Insomnia  Blood pressure 151/102, pulse 72, height  5\' 8"  (1.727 m), weight 151 lb (68.493 kg).  Physical Exam  General: The patient is alert and cooperative at the time of the examination.  Eyes: Pupils are equal, round, and reactive to light. Discs are flat bilaterally.  Neck: The neck is supple, no carotid bruits are noted.  Respiratory: The respiratory examination is  notable for bilateral wheezing, occasional rhonchi.  Cardiovascular: The cardiovascular examination reveals a regular rate and rhythm, no obvious murmurs or rubs are noted.  Skin: Extremities are without significant edema.  Neurologic Exam  Mental status: The patient is alert and oriented x 3 at the time of the examination. The patient has apparent normal recent and remote memory, with an apparently normal attention span and concentration ability.  Cranial nerves: Facial symmetry is present. There is good sensation of the face to pinprick and soft touch bilaterally. The strength of the facial muscles and the muscles to head turning and shoulder shrug are normal bilaterally. Speech is well enunciated, no aphasia or dysarthria is noted. Extraocular movements are full. Visual fields are full. The tongue is midline, and the patient has symmetric elevation of the soft palate. No obvious hearing deficits are noted.  Motor: The motor testing reveals 5 over 5 strength of all 4 extremities. Good symmetric motor tone is noted throughout.  Sensory: Sensory testing is intact to pinprick, soft touch, vibration sensation, and position sense on all 4 extremities. No evidence of extinction is noted.  Coordination: Cerebellar testing reveals good finger-nose-finger and heel-to-shin bilaterally.  Gait and station: Gait is normal. Tandem gait is normal. Romberg is negative. No drift is seen.  Reflexes: Deep tendon reflexes are symmetric and normal bilaterally. Toes are downgoing bilaterally.   Assessment/Plan:  1. History of Arnold-Chiari malformation  2. Facial numbness  3. Posterior headache  The patient appears to have a relatively unremarkable clinical examination. He reports subjective sensations of headache and facial numbness. The patient will undergo further workup at this time with blood work. He will undergo MRI evaluation of the brain with and without gadolinium enhancement. I will contact him  concerning the above results. The patient does take gabapentin, but he does not take it on a regular basis. A scheduled dosing of this medication may improve some of his symptoms.  Jill Alexanders MD 04/19/2015 2:45 PM  [facility] [address] [place], [place] [address]  Phone [phone] Fax [phone]

## 2015-04-19 NOTE — Patient Instructions (Addendum)
   We will check MRI of the brain and get blood work today.   Paresthesia Paresthesia is an abnormal burning or prickling sensation. This sensation is generally felt in the hands, arms, legs, or feet. However, it may occur in any part of the body. Usually, it is not painful. The feeling may be described as:  Tingling or numbness.  Pins and needles.  Skin crawling.  Buzzing.  Limbs falling asleep.  Itching. Most people experience temporary (transient) paresthesia at some time in their lives. Paresthesia may occur when you breathe too quickly (hyperventilation). It can also occur without any apparent cause. Commonly, paresthesia occurs when pressure is placed on a nerve. The sensation quickly goes away after the pressure is removed. For some people, however, paresthesia is a long-lasting (chronic) condition that is caused by an underlying disorder. If you continue to have paresthesia, you may need further medical evaluation. HOME CARE INSTRUCTIONS Watch your condition for any changes. Taking the following actions may help to lessen any discomfort that you are feeling:  Avoid drinking alcohol.  Try acupuncture or massage to help relieve your symptoms.  Keep all follow-up visits as directed by your health care provider. This is important. SEEK MEDICAL CARE IF:  You continue to have episodes of paresthesia.  Your burning or prickling feeling gets worse when you walk.  You have pain, cramps, or dizziness.  You develop a rash. SEEK IMMEDIATE MEDICAL CARE IF:  You feel weak.  You have trouble walking or moving.  You have problems with speech, understanding, or vision.  You feel confused.  You cannot control your bladder or bowel movements.  You have numbness after an injury.  You faint.   This information is not intended to replace advice given to you by your health care provider. Make sure you discuss any questions you have with your health care provider.   Document  Released: 05/29/2002 Document Revised: 10/23/2014 Document Reviewed: 06/04/2014 Elsevier Interactive Patient Education [employer].

## 2015-04-23 ENCOUNTER — Telehealth: Payer: Self-pay

## 2015-04-23 LAB — COMPREHENSIVE METABOLIC PANEL
A/G RATIO: 1.9 (ref 1.1–2.5)
ALBUMIN: 4.7 g/dL (ref 3.5–5.5)
ALK PHOS: 95 IU/L (ref 39–117)
ALT: 15 IU/L (ref 0–44)
AST: 17 IU/L (ref 0–40)
BILIRUBIN TOTAL: 0.4 mg/dL (ref 0.0–1.2)
BUN/Creatinine Ratio: 14 (ref 9–20)
BUN: 11 mg/dL (ref 6–24)
CHLORIDE: 104 mmol/L (ref 97–106)
CO2: 23 mmol/L (ref 18–29)
Calcium: 9.3 mg/dL (ref 8.7–10.2)
Creatinine, Ser: 0.81 mg/dL (ref 0.76–1.27)
GFR calc non Af Amer: 105 mL/min/{1.73_m2} (ref >59)
GFR, EST AFRICAN AMERICAN: 121 mL/min/{1.73_m2} (ref >59)
GLUCOSE: 88 mg/dL (ref 65–99)
Globulin, Total: 2.5 g/dL (ref 1.5–4.5)
POTASSIUM: 4.4 mmol/L (ref 3.5–5.2)
SODIUM: 143 mmol/L (ref 136–144)
TOTAL PROTEIN: 7.2 g/dL (ref 6.0–8.5)

## 2015-04-23 LAB — VITAMIN B12: Vitamin B-12: 311 pg/mL (ref 211–946)

## 2015-04-23 LAB — ANA W/REFLEX: Anti Nuclear Antibody(ANA): NEGATIVE

## 2015-04-23 LAB — COPPER, SERUM: Copper: 81 ug/dL (ref 72–166)

## 2015-04-23 LAB — B. BURGDORFI ANTIBODIES: Lyme IgG/IgM Ab: <0.91 {ISR} (ref 0.00–0.90)

## 2015-04-23 LAB — ANGIOTENSIN CONVERTING ENZYME: Angio Convert Enzyme: 66 U/L (ref 14–82)

## 2015-04-23 LAB — HIV ANTIBODY (ROUTINE TESTING W REFLEX): HIV Screen 4th Generation wRfx: NONREACTIVE

## 2015-04-23 LAB — SEDIMENTATION RATE: Sed Rate: 5 mm/hr (ref 0–15)

## 2015-04-23 LAB — RPR: RPR: NONREACTIVE

## 2015-04-23 NOTE — Telephone Encounter (Signed)
-----   Message from Kathrynn Ducking, MD sent at 04/23/2015  3:33 PM EDT -----  The blood work results are unremarkable. Please call the patient.  ----- Message -----    From: [facility] Lab Results In Interface    Sent: 04/20/2015   7:42 AM      To: Kathrynn Ducking, MD

## 2015-04-23 NOTE — Telephone Encounter (Signed)
I called the patient and relayed results. 

## 2015-04-24 ENCOUNTER — Other Ambulatory Visit: Payer: Self-pay | Admitting: Neurology

## 2015-04-24 DIAGNOSIS — Q0700 Arnold-Chiari syndrome without spina bifida or hydrocephalus: Secondary | ICD-10-CM

## 2015-04-24 DIAGNOSIS — R200 Anesthesia of skin: Secondary | ICD-10-CM

## 2015-04-26 ENCOUNTER — Ambulatory Visit
Admission: RE | Admit: 2015-04-26 | Discharge: 2015-04-26 | Disposition: A | Discharge status: Home / Self Care | Payer: Medicaid Other | Source: Ambulatory Visit | Attending: Neurology | Admitting: Neurology

## 2015-04-26 DIAGNOSIS — Q0700 Arnold-Chiari syndrome without spina bifida or hydrocephalus: Secondary | ICD-10-CM

## 2015-04-26 DIAGNOSIS — R200 Anesthesia of skin: Secondary | ICD-10-CM

## 2015-04-26 DIAGNOSIS — R208 Other disturbances of skin sensation: Secondary | ICD-10-CM | POA: Diagnosis not present

## 2015-04-28 ENCOUNTER — Telehealth: Payer: Self-pay | Admitting: Neurology

## 2015-04-28 NOTE — Telephone Encounter (Signed)
I called the patient. The MRI of the brain was relatively unremarkable. Nothing to explain the facial numbness. He is to take the gabapentin on a more regular basis.    MRI brain 04/26/15:  IMPRESSION: This MRI of the brain with and without contrast shows the following: 1. Prior occipital decompressive craniectomy. There is no mass effect on the cerebellum or brainstem. 2. Mild chronic inflammatory changes in the right maxillary sinus and some ethmoid air cells 3. There are no acute findings.

## 2015-04-30 NOTE — Telephone Encounter (Signed)
Spoke to patient. Went over message that Dr. Jannifer Franklin left for pt. Advised gabapentin would be part of plan of treatment for pain in the back of head also. Patient verbalized understanding.

## 2015-04-30 NOTE — Telephone Encounter (Signed)
I called patient. The patient has had a suboccipital craniectomy. The patient is having ongoing headaches in the back of the head, the gabapentin does help some of the facial numbness. Going up on the dose may be of some benefit, he only takes 300 mg at night. In the past, he has had some drowsiness on higher doses. We could use Gralise for this purpose if needed. He will contact me if he is not doing well.

## 2015-04-30 NOTE — Telephone Encounter (Addendum)
Pt called and is wondering if Dr. Tobey Grim suggestion is also related to the pain on the back of head, lower left part. He is a little confused about what was told to him. Please call and advise [phone]

## 2015-06-11 ENCOUNTER — Telehealth: Payer: Self-pay | Admitting: Neurology

## 2015-06-11 MED ORDER — GABAPENTIN (ONCE-DAILY) 600 MG PO TABS: 600.0000 mg | ORAL_TABLET | Freq: Every day | ORAL | Status: DC

## 2015-06-11 NOTE — Telephone Encounter (Signed)
Patient called regarding Gabapentin, states Dr. Jannifer Franklin mentioned another medication other than Gabapentin that doesn't have as many side effects, states Gabapentin makes him sleep too much, lesser dose doesn't work. Still experiencing pain on top of head and in back of head.

## 2015-06-11 NOTE — Telephone Encounter (Signed)
I called patient. Gabapentin helps some, but he cannot tolerate secondary to drowsiness. I will switch him to Gralise taking the 600 mg tablet once a day.

## 2015-07-29 ENCOUNTER — Other Ambulatory Visit (HOSPITAL_COMMUNITY): Payer: Self-pay | Admitting: Urology

## 2015-07-29 DIAGNOSIS — N281 Cyst of kidney, acquired: Secondary | ICD-10-CM

## 2015-08-09 ENCOUNTER — Ambulatory Visit (HOSPITAL_COMMUNITY): Payer: Medicaid Other

## 2015-08-14 ENCOUNTER — Ambulatory Visit (HOSPITAL_COMMUNITY)
Admission: RE | Admit: 2015-08-14 | Discharge: 2015-08-14 | Disposition: A | Discharge status: Home / Self Care | Payer: Medicaid Other | Source: Ambulatory Visit | Attending: Urology | Admitting: Urology

## 2015-08-14 DIAGNOSIS — N281 Cyst of kidney, acquired: Secondary | ICD-10-CM | POA: Insufficient documentation

## 2015-08-14 DIAGNOSIS — N289 Disorder of kidney and ureter, unspecified: Secondary | ICD-10-CM | POA: Insufficient documentation

## 2015-08-14 MED ORDER — GADOBENATE DIMEGLUMINE 529 MG/ML IV SOLN
15.0000 mL | Freq: Once | INTRAVENOUS | Status: AC | PRN
  Administered 2015-08-14: 14 mL via INTRAVENOUS

## 2015-09-30 ENCOUNTER — Other Ambulatory Visit: Payer: Self-pay | Admitting: Neurosurgery

## 2015-11-04 ENCOUNTER — Encounter (HOSPITAL_COMMUNITY): Payer: Self-pay

## 2015-11-04 ENCOUNTER — Encounter (HOSPITAL_COMMUNITY)
Admission: RE | Admit: 2015-11-04 | Discharge: 2015-11-04 | Disposition: A | Discharge status: Home / Self Care | Payer: Medicaid Other | Source: Ambulatory Visit | Attending: Neurosurgery | Admitting: Neurosurgery

## 2015-11-04 DIAGNOSIS — M4316 Spondylolisthesis, lumbar region: Secondary | ICD-10-CM | POA: Insufficient documentation

## 2015-11-04 DIAGNOSIS — Z01812 Encounter for preprocedural laboratory examination: Secondary | ICD-10-CM | POA: Diagnosis present

## 2015-11-04 DIAGNOSIS — Z0183 Encounter for blood typing: Secondary | ICD-10-CM | POA: Insufficient documentation

## 2015-11-04 HISTORY — DX: Pneumonia, unspecified organism: J18.9

## 2015-11-04 HISTORY — DX: Personal history of other diseases of the respiratory system: Z87.09

## 2015-11-04 HISTORY — DX: Personal history of other infectious and parasitic diseases: Z86.19

## 2015-11-04 HISTORY — DX: Nausea with vomiting, unspecified: R11.2

## 2015-11-04 HISTORY — DX: Other chronic pain: G89.29

## 2015-11-04 HISTORY — DX: Weakness: R53.1

## 2015-11-04 HISTORY — DX: Personal history of colonic polyps: Z86.010

## 2015-11-04 HISTORY — DX: Personal history of other medical treatment: Z92.89

## 2015-11-04 HISTORY — DX: Dizziness and giddiness: R42

## 2015-11-04 HISTORY — DX: Nocturia: R35.1

## 2015-11-04 HISTORY — DX: Personal history of colon polyps, unspecified: Z86.0100

## 2015-11-04 HISTORY — DX: Other specified postprocedural states: Z98.890

## 2015-11-04 HISTORY — DX: Dorsalgia, unspecified: M54.9

## 2015-11-04 HISTORY — DX: Frequency of micturition: R35.0

## 2015-11-04 LAB — BASIC METABOLIC PANEL
Anion gap: 11 (ref 5–15)
BUN: 7 mg/dL (ref 6–20)
CHLORIDE: 105 mmol/L (ref 101–111)
CO2: 24 mmol/L (ref 22–32)
Calcium: 9.5 mg/dL (ref 8.9–10.3)
Creatinine, Ser: 0.85 mg/dL (ref 0.61–1.24)
GFR calc Af Amer: >60 mL/min (ref >60)
GFR calc non Af Amer: >60 mL/min (ref >60)
Glucose, Bld: 91 mg/dL (ref 65–99)
POTASSIUM: 4.1 mmol/L (ref 3.5–5.1)
SODIUM: 140 mmol/L (ref 135–145)

## 2015-11-04 LAB — SURGICAL PCR SCREEN
MRSA, PCR: NEGATIVE
Staphylococcus aureus: NEGATIVE

## 2015-11-04 LAB — TYPE AND SCREEN
ABO/RH(D): A POS
Antibody Screen: NEGATIVE

## 2015-11-04 LAB — CBC
HCT: 42.1 % (ref 39.0–52.0)
Hemoglobin: 14 g/dL (ref 13.0–17.0)
MCH: 29.2 pg (ref 26.0–34.0)
MCHC: 33.3 g/dL (ref 30.0–36.0)
MCV: 87.9 fL (ref 78.0–100.0)
PLATELETS: 151 10*3/uL (ref 150–400)
RBC: 4.79 MIL/uL (ref 4.22–5.81)
RDW: 13.4 % (ref 11.5–15.5)
WBC: 8.3 10*3/uL (ref 4.0–10.5)

## 2015-11-04 NOTE — Pre-Procedure Instructions (Signed)
Tony Richardson  11/04/2015      [facility] - [place], [place] - [address] [place] [address] Phone: [phone] Fax: [phone]  [facility] - [place], [place] - [address] AT [place] [address] [place] [address] Phone: [phone] Fax: [phone]    Your procedure is scheduled on Wed, May 24 @ 8:30 AM  Report to [facility] Admitting at 6:30 AM  Call this number if you have problems the morning of surgery:  [phone]   Remember:  Do not eat food or drink liquids after midnight.  Take these medicines the morning of surgery with A SIP OF WATER Pain Pill(if needed)              No Goody's,BC's,Aleve,Aspirin,Ibuprofen,Advil,Motrin,Fish Oil,or any Herbal Medications.    Do not wear jewelry.  Do not wear lotions, powders, or colognes.               Men may shave face and neck.  Do not bring valuables to the hospital.  [facility] is not responsible for any belongings or valuables.  Contacts, dentures or bridgework may not be worn into surgery.  Leave your suitcase in the car.  After surgery it may be brought to your room.  For patients admitted to the hospital, discharge time will be determined by your treatment team.  Patients discharged the day of surgery will not be allowed to drive home.    [facility]  Before surgery, you can play an important role.  Because skin is not sterile, your skin needs to be as free of germs as possible.  You can reduce the number of germs on you skin by washing with CHG (chlorahexidine gluconate) soap before surgery.  CHG is an antiseptic cleaner which kills germs and bonds with the skin to continue killing germs even after washing.  Please DO NOT use if you have an allergy to CHG or antibacterial soaps.  If your skin becomes reddened/irritated stop using the CHG and inform your nurse  when you arrive at Short Stay.  Do not shave (including legs and underarms) for at least 48 hours prior to the first CHG shower.  You may shave your face.  Please follow these instructions carefully:   1.  Shower with CHG Soap the night before surgery and the                                morning of Surgery.  2.  If you choose to wash your hair, wash your hair first as usual with your       normal shampoo.  3.  After you shampoo, rinse your hair and body thoroughly to remove the                      Shampoo.  4.  Use CHG as you would any other liquid soap.  You can apply chg directly       to the skin and wash gently with scrungie or a clean washcloth.  5.  Apply the CHG Soap to your body ONLY FROM THE NECK DOWN.        Do not use on open wounds or open sores.  Avoid contact with your eyes,  ears, mouth and genitals (private parts).  Wash genitals (private parts)       with your normal soap.  6.  Wash thoroughly, paying special attention to the area where your surgery        will be performed.  7.  Thoroughly rinse your body with warm water from the neck down.  8.  DO NOT shower/wash with your normal soap after using and rinsing off       the CHG Soap.  9.  Pat yourself dry with a clean towel.            10.  Wear clean pajamas.            11.  Place clean sheets on your bed the night of your first shower and do not        sleep with pets.  Day of Surgery  Do not apply any lotions/deoderants the morning of surgery.  Please wear clean clothes to the hospital/surgery center.  ons:    Please read over the following fact sheets that you were given. Pain Booklet, Coughing and Deep Breathing, Blood Transfusion Information, MRSA Information and Surgical Site Infection Prevention

## 2015-11-04 NOTE — Progress Notes (Addendum)
Cardiologist denies  Medical Md is Dr.Alan Harrington Challenger  Echo denies  Stress test denies  Heart cath denies  EKG denies  CXR denies

## 2015-11-12 MED ORDER — CEFAZOLIN SODIUM-DEXTROSE 2-4 GM/100ML-% IV SOLN
2.0000 g | INTRAVENOUS | Status: AC
  Administered 2015-11-13: 2 g via INTRAVENOUS
  Filled 2015-11-12 (×2): qty 100

## 2015-11-12 NOTE — Anesthesia Preprocedure Evaluation (Addendum)
Anesthesia Evaluation  Patient identified by MRN, date of birth, ID band Patient awake    Reviewed: Allergy & Precautions, NPO status , Patient's Chart, lab work & pertinent test results  History of Anesthesia Complications (+) PONV and history of anesthetic complications  Airway Mallampati: II  TM Distance: >3 FB Neck ROM: Limited    Dental  (+) Teeth Intact, Dental Advisory Given   Pulmonary neg pulmonary ROS,    Pulmonary exam normal breath sounds clear to auscultation       Cardiovascular (-) hypertension(-) angina(-) Past MI negative cardio ROS Normal cardiovascular exam Rhythm:Regular Rate:Normal     Neuro/Psych  Headaches, history of Arnold-Chiari malformation, status post suboccipital craniectomy. The patient indicates that prior to surgery, he was reporting some facial numbness in the lower face and upper neck area anteriorly. This has not improved following surgery. reports of tingling sensations around the lips, particularly with the lower lip, occasionally affecting the gums and tongue as well.  negative psych ROS   GI/Hepatic negative GI ROS, Neg liver ROS,   Endo/Other  negative endocrine ROS  Renal/GU negative Renal ROS     Musculoskeletal negative musculoskeletal ROS (+)   Abdominal   Peds  Hematology negative hematology ROS (+)   Anesthesia Other Findings Day of surgery medications reviewed with the patient.  Reproductive/Obstetrics                         Anesthesia Physical Anesthesia Plan  ASA: II  Anesthesia Plan: General   Post-op Pain Management:    Induction: Intravenous  Airway Management Planned: Oral ETT  Additional Equipment:   Intra-op Plan:   Post-operative Plan: Extubation in OR  Informed Consent: I have reviewed the patients History and Physical, chart, labs and discussed the procedure including the risks, benefits and alternatives for the proposed  anesthesia with the patient or authorized representative who has indicated his/her understanding and acceptance.   Dental advisory given  Plan Discussed with: CRNA  Anesthesia Plan Comments: (Risks/benefits of general anesthesia discussed with patient including risk of damage to teeth, lips, gum, and tongue, nausea/vomiting, allergic reactions to medications, and the possibility of heart attack, stroke and death.  All patient questions answered.  Patient wishes to proceed.)        Anesthesia Quick Evaluation

## 2015-11-12 NOTE — H&P (Signed)
Tony Richardson is an 50 y.o. male.   Chief Complaint: lumbar pain HPI: patient see for several years with lumbar pain with radiation to both lower extremities no better with conservative treatment . Lately the pain is associtaded with ambulation  Past Medical History  Diagnosis Date  . Facial numbness 04/19/2015  . Arnold-Chiari deformity ([place]) 04/19/2015    lots of head pain  . PONV (postoperative nausea and vomiting)   . Pneumonia     hx of about 5-6 yrs ago  . History of bronchitis     many yrs ago  . Dizziness   . Weakness     numbness and tingling both hands and feet  . Chronic back pain     spondylolisthesis  . History of colon polyps     benign  . Urinary frequency     d/t prostate infection 15 yrs ago   . Nocturia   . History of blood transfusion     2 at birth.Issue was b/c of parents blood types per pt  . History of shingles     Past Surgical History  Procedure Laterality Date  . Thumb arthroscopy Right 1991  . Suboccipital craniectomy cervical laminectomy N/A 04/07/2013    Procedure: SUBOCCIPITAL CRANIECTOMY CERVICAL LAMINECTOMY/DURAPLASTY RESECTION POSTERIOR CERVICAL ONE;  Surgeon: Floyce Stakes, MD;  Location: [facility];  Service: Neurosurgery;  Laterality: N/A;  . Craniectomy suboccipital w/ cervical laminectomy / chiari    . Growth removed from back of right ear      as a child  . Colonoscopy    . Esophagogastroduodenoscopy      Family History  Problem Relation Age of Onset  . Hypertension Brother   . Colon cancer Maternal Aunt   . Colon cancer Paternal Aunt    Social History:  reports that he has never smoked. He has never used smokeless tobacco. He reports that he drinks alcohol. He reports that he does not use illicit drugs.  Allergies:  Allergies  Allergen Reactions  . Other Other (See Comments)    Per Pt needs to avoid steroid products due to his eyes.     No prescriptions prior to admission    No results found for this or any previous  visit (from the past 48 hour(s)). No results found.  Review of Systems  Constitutional: Negative.   Eyes: Negative.   Respiratory: Negative.   Cardiovascular: Negative.   Gastrointestinal: Negative.   Genitourinary: Negative.   Musculoskeletal: Positive for back pain and neck pain.  Skin: Negative.   Neurological: Positive for sensory change and focal weakness.  Endo/Heme/Allergies: Negative.   Psychiatric/Behavioral: Negative.     There were no vitals taken for this visit. Physical Exam hent, no. Neck scar from previous surgery. Cv, nl lungs, clear abdomen, nl. Extremities, nl. Neuro some weakness of DF both feet.  Sensory nl. In the upper extremities he hsa some weakness of biceps and wrist extensors   Lumbar myelogram showe l4-5 spondylolisthesis. The cervical dhows ddd at c56-67 Assessment/Plan Decompression and fusion al l45 with cages and screws. He is aware of risks and benefits  Floyce Stakes, MD 11/12/2015, 5:20 PM

## 2015-11-13 ENCOUNTER — Encounter (HOSPITAL_COMMUNITY): Payer: Self-pay | Admitting: *Deleted

## 2015-11-13 ENCOUNTER — Inpatient Hospital Stay (HOSPITAL_COMMUNITY)
Admission: RE | Admit: 2015-11-13 | Discharge: 2015-11-16 | DRG: 460 | Disposition: A | Discharge status: Home / Self Care | Payer: Medicaid Other | Source: Ambulatory Visit | Attending: Neurosurgery | Admitting: Neurosurgery

## 2015-11-13 ENCOUNTER — Inpatient Hospital Stay (HOSPITAL_COMMUNITY): Payer: Medicaid Other | Admitting: Anesthesiology

## 2015-11-13 ENCOUNTER — Inpatient Hospital Stay (HOSPITAL_COMMUNITY): Payer: Medicaid Other

## 2015-11-13 ENCOUNTER — Encounter (HOSPITAL_COMMUNITY)
Admission: RE | Disposition: A | Discharge status: Home / Self Care | Payer: Self-pay | Source: Ambulatory Visit | Attending: Neurosurgery

## 2015-11-13 DIAGNOSIS — Z419 Encounter for procedure for purposes other than remedying health state, unspecified: Secondary | ICD-10-CM

## 2015-11-13 DIAGNOSIS — M545 Low back pain: Secondary | ICD-10-CM | POA: Diagnosis present

## 2015-11-13 DIAGNOSIS — Z888 Allergy status to other drugs, medicaments and biological substances status: Secondary | ICD-10-CM | POA: Diagnosis not present

## 2015-11-13 DIAGNOSIS — M5416 Radiculopathy, lumbar region: Secondary | ICD-10-CM | POA: Diagnosis present

## 2015-11-13 DIAGNOSIS — M4316 Spondylolisthesis, lumbar region: Principal | ICD-10-CM | POA: Diagnosis present

## 2015-11-13 SURGERY — POSTERIOR LUMBAR FUSION 1 LEVEL
Anesthesia: General | Site: Back

## 2015-11-13 MED ORDER — ACETAMINOPHEN 325 MG PO TABS: 650.0000 mg | ORAL_TABLET | ORAL | Status: DC | PRN

## 2015-11-13 MED ORDER — ESMOLOL HCL 100 MG/10ML IV SOLN
INTRAVENOUS | Status: AC
  Filled 2015-11-13: qty 10

## 2015-11-13 MED ORDER — MENTHOL 3 MG MT LOZG: 1.0000 | LOZENGE | OROMUCOSAL | Status: DC | PRN

## 2015-11-13 MED ORDER — EPHEDRINE SULFATE 50 MG/ML IJ SOLN
INTRAMUSCULAR | Status: DC | PRN
  Administered 2015-11-13 (×2): 5 mg via INTRAVENOUS

## 2015-11-13 MED ORDER — BUPIVACAINE LIPOSOME 1.3 % IJ SUSP
INTRAMUSCULAR | Status: DC | PRN
  Administered 2015-11-13: 20 mL

## 2015-11-13 MED ORDER — ROCURONIUM BROMIDE 100 MG/10ML IV SOLN
INTRAVENOUS | Status: DC | PRN
  Administered 2015-11-13: 50 mg via INTRAVENOUS

## 2015-11-13 MED ORDER — DIPHENHYDRAMINE HCL 50 MG/ML IJ SOLN: 12.5000 mg | Freq: Four times a day (QID) | INTRAMUSCULAR | Status: DC | PRN

## 2015-11-13 MED ORDER — LIDOCAINE 2% (20 MG/ML) 5 ML SYRINGE
INTRAMUSCULAR | Status: AC
  Filled 2015-11-13: qty 5

## 2015-11-13 MED ORDER — FENTANYL CITRATE (PF) 100 MCG/2ML IJ SOLN
INTRAMUSCULAR | Status: AC
  Filled 2015-11-13: qty 2

## 2015-11-13 MED ORDER — PHENOL 1.4 % MT LIQD: 1.0000 | OROMUCOSAL | Status: DC | PRN

## 2015-11-13 MED ORDER — KETAMINE HCL 100 MG/ML IJ SOLN
INTRAMUSCULAR | Status: AC
  Administered 2015-11-13 (×4): 10 mg via INTRAVENOUS
  Filled 2015-11-13: qty 1

## 2015-11-13 MED ORDER — DIPHENHYDRAMINE HCL 12.5 MG/5ML PO ELIX: 12.5000 mg | ORAL_SOLUTION | Freq: Four times a day (QID) | ORAL | Status: DC | PRN

## 2015-11-13 MED ORDER — SUCCINYLCHOLINE CHLORIDE 200 MG/10ML IV SOSY
PREFILLED_SYRINGE | INTRAVENOUS | Status: AC
  Filled 2015-11-13: qty 10

## 2015-11-13 MED ORDER — NALOXONE HCL 0.4 MG/ML IJ SOLN: 0.4000 mg | INTRAMUSCULAR | Status: DC | PRN

## 2015-11-13 MED ORDER — FENTANYL CITRATE (PF) 100 MCG/2ML IJ SOLN
25.0000 ug | INTRAMUSCULAR | Status: DC | PRN
  Administered 2015-11-13 (×2): 50 ug via INTRAVENOUS

## 2015-11-13 MED ORDER — ROCURONIUM BROMIDE 50 MG/5ML IV SOLN
INTRAVENOUS | Status: AC
  Filled 2015-11-13: qty 1

## 2015-11-13 MED ORDER — PROPOFOL 10 MG/ML IV BOLUS
INTRAVENOUS | Status: AC
  Filled 2015-11-13: qty 20

## 2015-11-13 MED ORDER — CEFAZOLIN SODIUM 1-5 GM-% IV SOLN
1.0000 g | Freq: Three times a day (TID) | INTRAVENOUS | Status: AC
  Administered 2015-11-13 (×2): 1 g via INTRAVENOUS
  Filled 2015-11-13 (×2): qty 50

## 2015-11-13 MED ORDER — SODIUM CHLORIDE 0.9 % IV SOLN: 250.0000 mL | INTRAVENOUS | Status: DC

## 2015-11-13 MED ORDER — FENTANYL CITRATE (PF) 100 MCG/2ML IJ SOLN
INTRAMUSCULAR | Status: DC | PRN
  Administered 2015-11-13: 100 ug via INTRAVENOUS
  Administered 2015-11-13: 50 ug via INTRAVENOUS
  Administered 2015-11-13: 25 ug via INTRAVENOUS
  Administered 2015-11-13: 150 ug via INTRAVENOUS

## 2015-11-13 MED ORDER — 0.9 % SODIUM CHLORIDE (POUR BTL) OPTIME
TOPICAL | Status: DC | PRN
  Administered 2015-11-13: 1000 mL

## 2015-11-13 MED ORDER — LIDOCAINE HCL (CARDIAC) 20 MG/ML IV SOLN
INTRAVENOUS | Status: DC | PRN
  Administered 2015-11-13: 100 mg via INTRAVENOUS

## 2015-11-13 MED ORDER — ONDANSETRON HCL 4 MG/2ML IJ SOLN
INTRAMUSCULAR | Status: DC | PRN
  Administered 2015-11-13 (×2): 4 mg via INTRAVENOUS

## 2015-11-13 MED ORDER — DIAZEPAM 5 MG PO TABS
5.0000 mg | ORAL_TABLET | Freq: Four times a day (QID) | ORAL | Status: DC | PRN
  Administered 2015-11-14 – 2015-11-15 (×4): 5 mg via ORAL
  Filled 2015-11-13 (×4): qty 1

## 2015-11-13 MED ORDER — THROMBIN 20000 UNITS EX SOLR
CUTANEOUS | Status: DC | PRN
  Administered 2015-11-13: 09:00:00 via TOPICAL

## 2015-11-13 MED ORDER — SUGAMMADEX SODIUM 200 MG/2ML IV SOLN
INTRAVENOUS | Status: AC
  Filled 2015-11-13: qty 2

## 2015-11-13 MED ORDER — BUPIVACAINE LIPOSOME 1.3 % IJ SUSP
20.0000 mL | INTRAMUSCULAR | Status: AC
  Filled 2015-11-13: qty 20

## 2015-11-13 MED ORDER — FENTANYL CITRATE (PF) 100 MCG/2ML IJ SOLN
INTRAMUSCULAR | Status: AC
  Administered 2015-11-13: 50 ug via INTRAVENOUS
  Filled 2015-11-13: qty 2

## 2015-11-13 MED ORDER — MIDAZOLAM HCL 2 MG/2ML IJ SOLN
INTRAMUSCULAR | Status: AC
  Filled 2015-11-13: qty 2

## 2015-11-13 MED ORDER — LACTATED RINGERS IV SOLN
INTRAVENOUS | Status: DC | PRN
  Administered 2015-11-13 (×2): via INTRAVENOUS

## 2015-11-13 MED ORDER — ONDANSETRON HCL 4 MG/2ML IJ SOLN: 4.0000 mg | Freq: Four times a day (QID) | INTRAMUSCULAR | Status: DC | PRN

## 2015-11-13 MED ORDER — SODIUM CHLORIDE 0.9% FLUSH
3.0000 mL | Freq: Two times a day (BID) | INTRAVENOUS | Status: DC
  Administered 2015-11-14 – 2015-11-15 (×3): 3 mL via INTRAVENOUS

## 2015-11-13 MED ORDER — SODIUM CHLORIDE 0.9% FLUSH: 3.0000 mL | INTRAVENOUS | Status: DC | PRN

## 2015-11-13 MED ORDER — ONDANSETRON HCL 4 MG/2ML IJ SOLN
4.0000 mg | INTRAMUSCULAR | Status: DC | PRN
  Administered 2015-11-14 – 2015-11-16 (×4): 4 mg via INTRAVENOUS
  Filled 2015-11-13 (×4): qty 2

## 2015-11-13 MED ORDER — VANCOMYCIN HCL 1000 MG IV SOLR
INTRAVENOUS | Status: AC
  Filled 2015-11-13: qty 2000

## 2015-11-13 MED ORDER — ARTIFICIAL TEARS OP OINT
TOPICAL_OINTMENT | OPHTHALMIC | Status: AC
  Filled 2015-11-13: qty 3.5

## 2015-11-13 MED ORDER — PHENYLEPHRINE 40 MCG/ML (10ML) SYRINGE FOR IV PUSH (FOR BLOOD PRESSURE SUPPORT)
PREFILLED_SYRINGE | INTRAVENOUS | Status: AC
  Filled 2015-11-13: qty 10

## 2015-11-13 MED ORDER — MORPHINE SULFATE 2 MG/ML IV SOLN
INTRAVENOUS | Status: AC
  Filled 2015-11-13: qty 25

## 2015-11-13 MED ORDER — ACETAMINOPHEN 650 MG RE SUPP: 650.0000 mg | RECTAL | Status: DC | PRN

## 2015-11-13 MED ORDER — FENTANYL CITRATE (PF) 250 MCG/5ML IJ SOLN
INTRAMUSCULAR | Status: AC
  Filled 2015-11-13: qty 5

## 2015-11-13 MED ORDER — ONDANSETRON HCL 4 MG/2ML IJ SOLN
INTRAMUSCULAR | Status: AC
  Filled 2015-11-13: qty 2

## 2015-11-13 MED ORDER — ARTIFICIAL TEARS OP OINT
TOPICAL_OINTMENT | OPHTHALMIC | Status: DC | PRN
  Administered 2015-11-13: 1 via OPHTHALMIC

## 2015-11-13 MED ORDER — PROPOFOL 10 MG/ML IV BOLUS
INTRAVENOUS | Status: DC | PRN
  Administered 2015-11-13: 150 mg via INTRAVENOUS
  Administered 2015-11-13: 50 mg via INTRAVENOUS

## 2015-11-13 MED ORDER — SODIUM CHLORIDE 0.9% FLUSH: 9.0000 mL | INTRAVENOUS | Status: DC | PRN

## 2015-11-13 MED ORDER — VANCOMYCIN HCL 1000 MG IV SOLR
INTRAVENOUS | Status: DC | PRN
  Administered 2015-11-13 (×2): 1000 mg via TOPICAL

## 2015-11-13 MED ORDER — SENNA 8.6 MG PO TABS
1.0000 | ORAL_TABLET | Freq: Two times a day (BID) | ORAL | Status: DC
  Administered 2015-11-13 – 2015-11-16 (×7): 8.6 mg via ORAL
  Filled 2015-11-13 (×6): qty 1

## 2015-11-13 MED ORDER — SUGAMMADEX SODIUM 200 MG/2ML IV SOLN
INTRAVENOUS | Status: DC | PRN
  Administered 2015-11-13: 140 mg via INTRAVENOUS

## 2015-11-13 MED ORDER — MIDAZOLAM HCL 5 MG/5ML IJ SOLN
INTRAMUSCULAR | Status: DC | PRN
  Administered 2015-11-13 (×2): 1 mg via INTRAVENOUS

## 2015-11-13 MED ORDER — OXYCODONE-ACETAMINOPHEN 5-325 MG PO TABS
1.0000 | ORAL_TABLET | ORAL | Status: DC | PRN
Start: 2015-11-13 — End: 2015-11-16
  Administered 2015-11-14 – 2015-11-16 (×11): 2 via ORAL
  Filled 2015-11-13 (×11): qty 2

## 2015-11-13 MED ORDER — EPHEDRINE 5 MG/ML INJ
INTRAVENOUS | Status: AC
  Filled 2015-11-13: qty 10

## 2015-11-13 MED ORDER — SODIUM CHLORIDE 0.9 % IV SOLN
INTRAVENOUS | Status: DC
  Administered 2015-11-13 – 2015-11-14 (×2): via INTRAVENOUS

## 2015-11-13 MED ORDER — MORPHINE SULFATE 2 MG/ML IV SOLN
INTRAVENOUS | Status: DC
  Administered 2015-11-13: 5.75 mg via INTRAVENOUS
  Administered 2015-11-13: 12:00:00 via INTRAVENOUS
  Administered 2015-11-13: 34.5 mg via INTRAVENOUS
  Administered 2015-11-14: 08:00:00 via INTRAVENOUS
  Administered 2015-11-14: 12 mg via INTRAVENOUS
  Filled 2015-11-13: qty 25

## 2015-11-13 MED ORDER — SUCCINYLCHOLINE CHLORIDE 20 MG/ML IJ SOLN
INTRAMUSCULAR | Status: DC | PRN
  Administered 2015-11-13: 100 mg via INTRAVENOUS

## 2015-11-13 MED FILL — Heparin Sodium (Porcine) Inj 1000 Unit/ML: INTRAMUSCULAR | Qty: 30 | Status: AC

## 2015-11-13 MED FILL — Sodium Chloride IV Soln 0.9%: INTRAVENOUS | Qty: 1000 | Status: AC

## 2015-11-13 SURGICAL SUPPLY — 69 items
BENZOIN TINCTURE PRP APPL 2/3 (GAUZE/BANDAGES/DRESSINGS) ×3 IMPLANT
BLADE CLIPPER SURG (BLADE) IMPLANT
BUR ACORN 6.0 (BURR) ×2 IMPLANT
BUR ACORN 6.0MM (BURR) ×1
BUR MATCHSTICK NEURO 3.0 LAGG (BURR) ×3 IMPLANT
CANISTER SUCT 3000ML PPV (MISCELLANEOUS) ×3 IMPLANT
CAP LOCKING THREADED (Cap) ×12 IMPLANT
CLOSURE WOUND 1/2 X4 (GAUZE/BANDAGES/DRESSINGS) ×1
CONT SPEC 4OZ CLIKSEAL STRL BL (MISCELLANEOUS) ×3 IMPLANT
COVER BACK TABLE 60X90IN (DRAPES) ×3 IMPLANT
DRAPE C-ARM 42X72 X-RAY (DRAPES) ×6 IMPLANT
DRAPE LAPAROTOMY 100X72X124 (DRAPES) ×3 IMPLANT
DRAPE POUCH INSTRU U-SHP 10X18 (DRAPES) ×3 IMPLANT
DRSG PAD ABDOMINAL 8X10 ST (GAUZE/BANDAGES/DRESSINGS) IMPLANT
DURAPREP 26ML APPLICATOR (WOUND CARE) ×3 IMPLANT
ELECT BLADE 4.0 EZ CLEAN MEGAD (MISCELLANEOUS) ×3
ELECT REM PT RETURN 9FT ADLT (ELECTROSURGICAL) ×3
ELECTRODE BLDE 4.0 EZ CLN MEGD (MISCELLANEOUS) ×1 IMPLANT
ELECTRODE REM PT RTRN 9FT ADLT (ELECTROSURGICAL) ×1 IMPLANT
EVACUATOR 1/8 PVC DRAIN (DRAIN) IMPLANT
GAUZE SPONGE 4X4 12PLY STRL (GAUZE/BANDAGES/DRESSINGS) ×3 IMPLANT
GAUZE SPONGE 4X4 16PLY XRAY LF (GAUZE/BANDAGES/DRESSINGS) IMPLANT
GLOVE BIOGEL M 8.0 STRL (GLOVE) ×6 IMPLANT
GLOVE BIOGEL PI IND STRL 7.0 (GLOVE) ×1 IMPLANT
GLOVE BIOGEL PI IND STRL 7.5 (GLOVE) ×1 IMPLANT
GLOVE BIOGEL PI INDICATOR 7.0 (GLOVE) ×2
GLOVE BIOGEL PI INDICATOR 7.5 (GLOVE) ×2
GLOVE EXAM NITRILE LRG STRL (GLOVE) IMPLANT
GLOVE EXAM NITRILE MD LF STRL (GLOVE) IMPLANT
GLOVE EXAM NITRILE XL STR (GLOVE) IMPLANT
GLOVE EXAM NITRILE XS STR PU (GLOVE) IMPLANT
GLOVE SS BIOGEL STRL SZ 7 (GLOVE) ×1 IMPLANT
GLOVE SUPERSENSE BIOGEL SZ 7 (GLOVE) ×2
GLOVE SURG SS PI 6.5 STRL IVOR (GLOVE) ×12 IMPLANT
GOWN STRL REUS W/ TWL LRG LVL3 (GOWN DISPOSABLE) ×3 IMPLANT
GOWN STRL REUS W/ TWL XL LVL3 (GOWN DISPOSABLE) ×1 IMPLANT
GOWN STRL REUS W/TWL 2XL LVL3 (GOWN DISPOSABLE) IMPLANT
GOWN STRL REUS W/TWL LRG LVL3 (GOWN DISPOSABLE) ×6
GOWN STRL REUS W/TWL XL LVL3 (GOWN DISPOSABLE) ×2
KIT BASIN OR (CUSTOM PROCEDURE TRAY) ×3 IMPLANT
KIT INFUSE SMALL (Orthopedic Implant) ×3 IMPLANT
KIT ROOM TURNOVER OR (KITS) ×3 IMPLANT
NEEDLE HYPO 18GX1.5 BLUNT FILL (NEEDLE) IMPLANT
NEEDLE HYPO 21X1.5 SAFETY (NEEDLE) ×3 IMPLANT
NEEDLE HYPO 25X1 1.5 SAFETY (NEEDLE) IMPLANT
NS IRRIG 1000ML POUR BTL (IV SOLUTION) ×3 IMPLANT
PACK LAMINECTOMY NEURO (CUSTOM PROCEDURE TRAY) ×3 IMPLANT
PAD ARMBOARD 7.5X6 YLW CONV (MISCELLANEOUS) ×9 IMPLANT
PATTIES SURGICAL .5 X1 (DISPOSABLE) IMPLANT
PATTIES SURGICAL .5 X3 (DISPOSABLE) IMPLANT
ROD CREO 45MM SPINAL (Rod) ×6 IMPLANT
SCREW CREO THREADED 5.5X45MM (Screw) ×12 IMPLANT
SPACER RISE 8X22 11-17MM-15 (Spacer) ×6 IMPLANT
SPONGE LAP 4X18 X RAY DECT (DISPOSABLE) IMPLANT
SPONGE NEURO XRAY DETECT 1X3 (DISPOSABLE) IMPLANT
SPONGE SURGIFOAM ABS GEL 100 (HEMOSTASIS) ×3 IMPLANT
STRIP CLOSURE SKIN 1/2X4 (GAUZE/BANDAGES/DRESSINGS) ×2 IMPLANT
SUT VIC AB 1 CT1 18XBRD ANBCTR (SUTURE) ×2 IMPLANT
SUT VIC AB 1 CT1 8-18 (SUTURE) ×4
SUT VIC AB 2-0 CP2 18 (SUTURE) ×6 IMPLANT
SUT VIC AB 3-0 SH 8-18 (SUTURE) ×3 IMPLANT
SYR 20CC LL (SYRINGE) ×3 IMPLANT
SYR 5ML LL (SYRINGE) IMPLANT
TAPE CLOTH SURG 4X10 WHT LF (GAUZE/BANDAGES/DRESSINGS) ×3 IMPLANT
TOWEL OR 17X24 6PK STRL BLUE (TOWEL DISPOSABLE) ×3 IMPLANT
TOWEL OR 17X26 10 PK STRL BLUE (TOWEL DISPOSABLE) ×3 IMPLANT
TRAY FOLEY CATH 16FRSI W/METER (SET/KITS/TRAYS/PACK) ×3 IMPLANT
TRAY FOLEY W/METER SILVER 14FR (SET/KITS/TRAYS/PACK) IMPLANT
WATER STERILE IRR 1000ML POUR (IV SOLUTION) ×3 IMPLANT

## 2015-11-13 NOTE — Op Note (Signed)
NAMETARYLL, Tony Richardson                   ACCOUNT NO.:  [id number]  MEDICAL RECORD NO.:  [id number]  LOCATION:  [facility]                         FACILITY:  [facility]  PHYSICIAN:  Leeroy Cha, M.D.   DATE OF BIRTH:  1966/03/14  DATE OF PROCEDURE: DATE OF DISCHARGE:                              OPERATIVE REPORT   PREOPERATIVE DIAGNOSIS:  L4-5 spondylolisthesis with chronic radiculopathy, neurogenic claudication.  POSTOPERATIVE DIAGNOSIS:  L4-5 spondylolisthesis with chronic radiculopathy, neurogenic claudication.  PROCEDURES:  L4 Gill procedure, which involved removal of spinous process, lamina and facet of L4, bilateral total diskectomy more than normal, to be able to introduce two expandable cages.  Pedicle screws at L4-L5.  Posterolateral arthrodesis from L4-L5 using autograft and BMP. Cell Saver.  C-arm.  SURGEON:  Leeroy Cha, M.D.  CLINICAL HISTORY:  [name] is a gentleman who had been complaining of back pain radiation to both legs.  We know by x-ray that he has a bifurcation stenosis secondary to spondylolisthesis at the level of 4-5. He came to the point that he cannot walk.  Surgery was advised and he knew the risk and benefit.  DESCRIPTION OF PROCEDURE:  The patient was taken to the OR and after intubation, he was positioned in a prone manner.  The back was cleaned with DuraPrep.  Drapes were applied.  Midline incision from L3-4 down to L4-5 was made and muscle retracted laterally.  X-rays showed that indeed we were right at the level of L4.  The posterior arch was completely loose.  We started our Gill procedure removing the spinous process of L4, the lamina and then the facet that came easily after we removed the scar tissue.  Lysis of adhesion was done and the thecal sac as well as the L4 and L5 were cleaned out of scar tissue with foraminotomy also. Then, we entered the disk space first in the right side and in the left side and bilateral diskectomy more than normal, to  be able to introduce two cages, which were full of BMP and autograft.  The cages were like 10- mm lordotics and expanded to 17-mm.  Then, the rest of the disk space was filled up with a mix of BMP and autograft.  Then, using the C-arm in AP view and then a lateral view, we made holes at the pedicle of L4-L5. Prior to introduction of the screws, I feel all four quadrants just to be sure that we were surrounded by bone.  These screws were 5.5 x 45, and they were kept in place with two rods.  Then, having good fixation of the area, we went laterally and we removed the periosteum of the transverse process of 4-5 as well as the facet.  Then, a mix of BMP and autograft was used for arthrodesis.  The area was irrigated.  Drain was left, vancomycin powder in the operative site and the wound was closed with several layers of nylon and Steri-Strip.         ______________________________ Leeroy Cha, M.D.    EB/MEDQ  D:  11/13/2015  T:  11/13/2015  Job:  [id number]

## 2015-11-13 NOTE — Anesthesia Procedure Notes (Signed)
Procedure Name: Intubation Date/Time: 11/13/2015 8:41 AM Performed by: Suzy Bouchard Pre-anesthesia Checklist: Patient identified, Timeout performed, Emergency Drugs available, Suction available and Patient being monitored Patient Re-evaluated:Patient Re-evaluated prior to inductionOxygen Delivery Method: Circle system utilized Preoxygenation: Pre-oxygenation with 100% oxygen Intubation Type: IV induction, Rapid sequence and Cricoid Pressure applied Laryngoscope Size: Miller and 2 Grade View: Grade III Tube type: Oral Tube size: 7.5 mm Number of attempts: 1 Airway Equipment and Method: Stylet Placement Confirmation: ETT inserted through vocal cords under direct vision,  positive ETCO2 and breath sounds checked- equal and bilateral Secured at: 22 cm Tube secured with: Tape Dental Injury: Teeth and Oropharynx as per pre-operative assessment  Comments: Oropharynx clear on DL.  Rapid sequence due to patient being nauseated this morning.  Grade 3 view with miller 2, able to see cords with external downward pressure on glottis.

## 2015-11-13 NOTE — Care Management Note (Signed)
Case Management Note  Patient Details  Name: Tony Richardson MRN: [id number] Date of Birth: 06-27-1965  Subjective/Objective:                    Action/Plan: Patient was admitted for a Lumbar four-five Posterior lumbar interbody fusion. Lives at home with spouse.  Will follow for discharge needs pending PT/OT evals and physician orders.  Expected Discharge Date:                  Expected Discharge Plan:     In-House Referral:     Discharge planning Services     Post Acute Care Choice:    Choice offered to:     DME Arranged:    DME Agency:     HH Arranged:    HH Agency:     Status of Service:  In process, will continue to follow  Medicare Important Message Given:    Date Medicare IM Given:    Medicare IM give by:    Date Additional Medicare IM Given:    Additional Medicare Important Message give by:     If discussed at [place] of Stay Meetings, dates discussed:    Additional Comments:  Rolm Baptise, RN 11/13/2015, 3:39 PM [phone]

## 2015-11-13 NOTE — Anesthesia Postprocedure Evaluation (Signed)
Anesthesia Post Note  Patient: Tony Richardson  Procedure(s) Performed: Procedure(s) (LRB): Lumbar four-five Posterior lumbar interbody fusion (N/A)  Patient location during evaluation: PACU Anesthesia Type: General Level of consciousness: awake and alert Pain management: pain level controlled Vital Signs Assessment: post-procedure vital signs reviewed and stable Respiratory status: spontaneous breathing, nonlabored ventilation, respiratory function stable and patient connected to nasal cannula oxygen Cardiovascular status: blood pressure returned to baseline and stable Postop Assessment: no signs of nausea or vomiting Anesthetic complications: no    Last Vitals:  Filed Vitals:   11/13/15 1215 11/13/15 1230  BP:    Pulse: 82 69  Temp:  36.8 C  Resp: 10 14    Last Pain:  Filed Vitals:   11/13/15 1240  PainSc: [name]

## 2015-11-13 NOTE — Progress Notes (Signed)
Patient arrived from PACU to 5C15. Safety precautions and orders reviewed with patient. Encourage PCA for pain control at this time. ICS reviewed and encourage. No other distress noted. Will continue to monitor.  Ave Filter, RN

## 2015-11-13 NOTE — Transfer of Care (Signed)
Immediate Anesthesia Transfer of Care Note  Patient: Tony Richardson  Procedure(s) Performed: Procedure(s): Lumbar four-five Posterior lumbar interbody fusion (N/A)  Patient Location: PACU  Anesthesia Type:General  Level of Consciousness: awake  Airway & Oxygen Therapy: Patient Spontanous Breathing and Patient connected to nasal cannula oxygen  Post-op Assessment: Report given to RN, Post -op Vital signs reviewed and stable and Patient moving all extremities  Post vital signs: Reviewed and stable  Last Vitals:  Filed Vitals:   11/13/15 0707 11/13/15 1119  BP: 130/77 116/80  Pulse:  95  Temp: 36.5 C 36.5 C  Resp: 16 15    Last Pain: There were no vitals filed for this visit.       Complications: No apparent anesthesia complications

## 2015-11-14 NOTE — Progress Notes (Signed)
Occupational Therapy Evaluation Patient Details Name: Tony Richardson MRN: [id number] DOB: 25-Jun-1965 Today's Date: 11/14/2015    History of Present Illness s/p Lumbar four-five Posterior lumbar interbody fusion   Clinical Impression   Pt admitted with the above diagnoses and presents with below problem list. Pt will benefit from continued acute OT to address the below listed deficits and maximize independence with BADLs prior to d/c home with family. PTA pt was independent with ADLs. Pt is currently min guard to min A with LB ADLs.      Follow Up Recommendations  No OT follow up;Supervision - Intermittent;Other (comment) (OOB/mobility)    Equipment Recommendations  3 in 1 bedside comode    Recommendations for Other Services PT consult     Precautions / Restrictions Precautions Precautions: Back;Fall Precaution Booklet Issued: No Precaution Comments: educated on BAT precautions Required Braces or Orthoses: Spinal Brace Spinal Brace: Lumbar corset Restrictions Weight Bearing Restrictions: No      Mobility Bed Mobility Overal bed mobility: Needs Assistance Bed Mobility: Rolling;Sidelying to Sit;Sit to Sidelying Rolling: Min guard Sidelying to sit: Min guard     Sit to sidelying: Min guard General bed mobility comments: bed rail used.   Transfers Overall transfer level: Needs assistance Equipment used: Rolling walker (2 wheeled) Transfers: Sit to/from Stand Sit to Stand: Min guard         General transfer comment: from EOB and 3n1. Cues for technique.    Balance Overall balance assessment: Needs assistance Sitting-balance support: No upper extremity supported;Feet supported Sitting balance-Leahy Scale: Fair     Standing balance support: Bilateral upper extremity supported Standing balance-Leahy Scale: Fair                              ADL Overall ADL's : Needs assistance/impaired Eating/Feeding: Set up;Sitting   Grooming: Set up;Min  guard;Sitting;Standing   Upper Body Bathing: Sitting;Set up   Lower Body Bathing: Min guard;Sit to/from stand   Upper Body Dressing : Sitting;Set up   Lower Body Dressing: Min guard;Sit to/from stand   Toilet Transfer: Min guard;Ambulation;RW (3n1 over toilet)   Toileting- Clothing Manipulation and Hygiene: Min guard;Minimal assistance;Sit to/from stand Toileting - Clothing Manipulation Details (indicate cue type and reason): educated on AE Tub/ Shower Transfer: Walk-in shower;Min guard;Ambulation;Rolling walker;3 in 1   Functional mobility during ADLs: Min guard;Rolling walker General ADL Comments: Pt completed toilet transfer and household distance functiaonl mobility. at min guard level. Able to reach LEs in seated position. ADL and back precautions eduacation given including strategies and AE/DME.     Vision     Perception     Praxis      Pertinent Vitals/Pain Pain Assessment: 0-10 Pain Score: 7  Pain Descriptors / Indicators: Aching;Sore Pain Intervention(s): Limited activity within patient's tolerance;Monitored during session;Repositioned     Hand Dominance Right   Extremity/Trunk Assessment Upper Extremity Assessment Upper Extremity Assessment: Overall WFL for tasks assessed   Lower Extremity Assessment Lower Extremity Assessment: Defer to PT evaluation       Communication Communication Communication: No difficulties   Cognition Arousal/Alertness: Awake/alert Behavior During Therapy: WFL for tasks assessed/performed Overall Cognitive Status: Within Functional Limits for tasks assessed                     General Comments    Pt's IV displaced at end of session. Nursing notified and present at end of session.     Exercises  Shoulder Instructions      Home Living Family/patient expects to be discharged to:: Private residence Living Arrangements: Spouse/significant other;Children;Other (Comment) (32 yo son) Available Help at Discharge:  Family;Available 24 hours/day Type of Home: House Home Access: Level entry     Home Layout: Two level;Able to live on main level with bedroom/bathroom Alternate Level Stairs-Number of Steps: 5-6 steps; 7 no rails (split level) Alternate Level Stairs-Rails: Left Bathroom Shower/Tub: Occupational psychologist: Standard     Home Equipment: None          Prior Functioning/Environment Level of Independence: Independent             OT Diagnosis: Acute pain   OT Problem List: Impaired balance (sitting and/or standing);Decreased knowledge of use of DME or AE;Decreased knowledge of precautions;Pain   OT Treatment/Interventions: Self-care/ADL training;DME and/or AE instruction;Therapeutic activities;Patient/family education;Balance training    OT Goals(Current goals can be found in the care plan section) Acute Rehab OT Goals Patient Stated Goal: not stated OT Goal Formulation: With patient Time For Goal Achievement: 11/21/15 Potential to Achieve Goals: Good ADL Goals Pt Will Perform Grooming: with modified independence;standing;sitting Pt Will Perform Lower Body Bathing: with modified independence;sit to/from stand Pt Will Perform Lower Body Dressing: with modified independence;sit to/from stand Pt Will Transfer to Toilet: with modified independence;ambulating (3n1 over toilet) Pt Will Perform Toileting - Clothing Manipulation and hygiene: with modified independence;sitting/lateral leans;sit to/from stand;with adaptive equipment Pt Will Perform Tub/Shower Transfer: Shower transfer;with supervision;ambulating;3 in 1;rolling walker  OT Frequency: Min 2X/week   Barriers to D/C:            Co-evaluation              End of Session Equipment Utilized During Treatment: Rolling walker;Back brace Nurse Communication: Other (comment) (IV displaced at end of session.)  Activity Tolerance: Patient tolerated treatment well;Patient limited by pain Patient left: in  bed;with call bell/phone within reach;with nursing/sitter in room   Time: 0910-0940 OT Time Calculation (min): 30 min Charges:  OT General Charges $OT Visit: 1 Procedure OT Evaluation $OT Eval Low Complexity: 1 Procedure OT Treatments $Self Care/Home Management : 8-22 mins G-Codes:    Hortencia Pilar 11-15-15, 10:08 AM

## 2015-11-14 NOTE — Evaluation (Signed)
Physical Therapy Evaluation Patient Details Name: Tony Richardson MRN: [id number] DOB: 04-Jul-1965 Today's Date: 11/14/2015   History of Present Illness  s/p Lumbar four-five Posterior lumbar interbody fusion  Clinical Impression  Patient presents with pain and post surgical deficits s/p above surgery. Tolerated gait training with supervision for safety. Pt very guarded and tense during mobility. Reviewed back precautions. Pt will have support of 50 y/o at home. Plan for stair training tomorrow as tolerated. Will follow acutely to maximize independence and mobility prior to return home.    Follow Up Recommendations No PT follow up;Supervision - Intermittent    Equipment Recommendations  None recommended by PT    Recommendations for Other Services       Precautions / Restrictions Precautions Precautions: Back;Fall Precaution Booklet Issued: No Precaution Comments: Reviewed 3/3 back precautions Required Braces or Orthoses: Spinal Brace Spinal Brace: Lumbar corset Restrictions Weight Bearing Restrictions: No      Mobility  Bed Mobility Overal bed mobility: Needs Assistance Bed Mobility: Rolling;Sidelying to Sit Rolling: Supervision Sidelying to sit: Supervision     Sit to sidelying: Min guard General bed mobility comments: HOB flat, no use of rails to simulate home.   Transfers Overall transfer level: Needs assistance Equipment used: Rolling walker (2 wheeled) Transfers: Sit to/from Stand Sit to Stand: Min guard         General transfer comment: Min guard for safety. Stood from [employer] x2; transferred to chair post ambulation bout.  Ambulation/Gait Ambulation/Gait assistance: Supervision Ambulation Distance (Feet): 150 Feet Assistive device: Rolling walker (2 wheeled) Gait Pattern/deviations: Step-through pattern;Decreased stride length Gait velocity: decreased Gait velocity interpretation: Below normal speed for age/gender General Gait Details: Slow, guarded gait. Very  tense in shoulders.   Stairs            Wheelchair Mobility    Modified Rankin (Stroke Patients Only)       Balance Overall balance assessment: Needs assistance Sitting-balance support: Feet supported;No upper extremity supported Sitting balance-Leahy Scale: Fair     Standing balance support: During functional activity Standing balance-Leahy Scale: Fair                               Pertinent Vitals/Pain Pain Assessment: 0-10 Pain Score: 5  Pain Location: back at surgical site Pain Descriptors / Indicators: Sore Pain Intervention(s): Monitored during session;Premedicated before session;Repositioned    Home Living Family/patient expects to be discharged to:: Private residence Living Arrangements: Spouse/significant other;Children;Other (Comment) (69 y/o son) Available Help at Discharge: Family;Available 24 hours/day Type of Home: House Home Access: Level entry     Home Layout: Two level;Able to live on main level with bedroom/bathroom Home Equipment: None      Prior Function Level of Independence: Independent               Hand Dominance   Dominant Hand: Right    Extremity/Trunk Assessment   Upper Extremity Assessment: Defer to OT evaluation           Lower Extremity Assessment: Overall WFL for tasks assessed         Communication   Communication: No difficulties  Cognition Arousal/Alertness: Awake/alert Behavior During Therapy: WFL for tasks assessed/performed Overall Cognitive Status: Within Functional Limits for tasks assessed       Memory: Decreased recall of precautions              General Comments General comments (skin integrity, edema, etc.): Hemovac intact.  Exercises        Assessment/Plan    PT Assessment Patient needs continued PT services  PT Diagnosis Acute pain;Difficulty walking   PT Problem List Decreased strength;Pain;Decreased activity tolerance;Decreased balance;Decreased  mobility;Decreased knowledge of precautions  PT Treatment Interventions Balance training;Gait training;Stair training;Functional mobility training;Therapeutic exercise;Therapeutic activities;Patient/family education   PT Goals (Current goals can be found in the Care Plan section) Acute Rehab PT Goals Patient Stated Goal: to go home PT Goal Formulation: With patient Time For Goal Achievement: 11/28/15 Potential to Achieve Goals: Good    Frequency Min 5X/week   Barriers to discharge Inaccessible home environment stairs to enter home    Co-evaluation               End of Session Equipment Utilized During Treatment: Back brace Activity Tolerance: Patient tolerated treatment well Patient left: in chair;with call bell/phone within reach Nurse Communication: Mobility status         Time: [id number] PT Time Calculation (min) (ACUTE ONLY): 20 min   Charges:   PT Evaluation $PT Eval Moderate Complexity: 1 Procedure     PT G Codes:        Fonnie Crookshanks A Cheryel Kyte 11/14/2015, 12:10 PM Wray Kearns, Frederic, DPT [phone]

## 2015-11-14 NOTE — Progress Notes (Signed)
Patient ID: Tony Richardson, male   DOB: [birth year], 50 y.o.   MRN: [id number] Ambulating with help. C/o incisional pain

## 2015-11-15 NOTE — Progress Notes (Signed)
Patient is resting in his bed, has requested and received pain medication Q4 as ordered. Will continue to monitor.

## 2015-11-15 NOTE — Progress Notes (Signed)
Physical Therapy Treatment Patient Details Name: Tony Richardson MRN: [id number] DOB: [birth year] Today's Date: 11/15/2015    History of Present Illness s/p Lumbar four-five Posterior lumbar interbody fusion    PT Comments    Patient currently mod I/Supervision for OOB mobility. Min guard for stair training with no unsteadiness or weakness noted. Pt recalled 3/3 precautions and maintain them throughout session. Current plan remains appropriate.   Follow Up Recommendations  No PT follow up;Supervision - Intermittent     Equipment Recommendations  None recommended by PT    Recommendations for Other Services       Precautions / Restrictions Restrictions Weight Bearing Restrictions: No    Mobility  Bed Mobility Overal bed mobility: Modified Independent Bed Mobility: Rolling;Sidelying to Sit;Sit to Sidelying Rolling: Modified independent (Device/Increase time) Sidelying to sit: Modified independent (Device/Increase time)     Sit to sidelying: Modified independent (Device/Increase time) General bed mobility comments: carry over of sequencing/technique and maintain back precuations; increased time and use of bed rails to scoot up in bed  Transfers Overall transfer level: Needs assistance Equipment used: None Transfers: Sit to/from Stand Sit to Stand: Supervision         General transfer comment: supervision for safety from EOB; safe hand placement and technique; increased time and cues to keep head up   Ambulation/Gait Ambulation/Gait assistance: Supervision Ambulation Distance (Feet): 180 Feet Assistive device: None Gait Pattern/deviations: Step-through pattern;Decreased stride length;Narrow base of support Gait velocity: decreased   General Gait Details: slow cadence; no unsteadiness noted but very guarded movements; supervision for safety   Stairs Stairs: Yes Stairs assistance: Min guard Stair Management: One rail Right;One rail Left;Forwards Number of Stairs:  10 General stair comments: educated on sequencing and step to pattern; min guard for safety  Wheelchair Mobility    Modified Rankin (Stroke Patients Only)       Balance   Sitting-balance support: Feet supported;No upper extremity supported Sitting balance-Leahy Scale: Fair     Standing balance support: No upper extremity supported Standing balance-Leahy Scale: Fair                      Cognition Arousal/Alertness: Awake/alert Behavior During Therapy: WFL for tasks assessed/performed Overall Cognitive Status: Within Functional Limits for tasks assessed       Memory: Decreased recall of precautions              Exercises      General Comments        Pertinent Vitals/Pain Pain Assessment: Faces Faces Pain Scale: Hurts little more Pain Location: back Pain Descriptors / Indicators: Grimacing;Guarding    Home Living                      Prior Function            PT Goals (current goals can now be found in the care plan section) Acute Rehab PT Goals Patient Stated Goal: go home Progress towards PT goals: Progressing toward goals    Frequency  Min 5X/week    PT Plan Current plan remains appropriate    Co-evaluation             End of Session Equipment Utilized During Treatment: Back brace;Gait belt Activity Tolerance: Patient tolerated treatment well Patient left: with call bell/phone within reach;in bed     Time: 1203-1221 PT Time Calculation (min) (ACUTE ONLY): 18 min  Charges:  $Gait Training: 8-22 mins  G Codes:      Salina April, PTA Pager: [phone]   11/15/2015, 12:29 PM

## 2015-11-15 NOTE — Progress Notes (Signed)
Patient ID: Tony Richardson, male   DOB: March 16, 1966, 50 y.o.   MRN: [id number] Doing well, ambulating in his own, no weakness

## 2015-11-16 MED ORDER — HYDROXYZINE PAMOATE 50 MG PO CAPS: 50.0000 mg | ORAL_CAPSULE | ORAL | Status: DC | PRN

## 2015-11-16 MED ORDER — OXYCODONE-ACETAMINOPHEN 5-325 MG PO TABS: 1.0000 | ORAL_TABLET | ORAL | Status: DC | PRN

## 2015-11-16 NOTE — Progress Notes (Signed)
Discharge reviewed with patient and family-no further questions, patient is transported to family vehicle by staff.

## 2015-11-16 NOTE — Discharge Summary (Signed)
Physician Discharge Summary  Patient ID: Tony Richardson MRN: [id number] DOB/AGE: Feb 10, 1966 50 y.o.  Admit date: 11/13/2015 Discharge date: 11/16/2015  Admission Diagnoses:  L4-5 spondylolisthesis, neurogenic claudication, lumbar radiculopathy  Discharge Diagnoses:  L4-5 spondylolisthesis, neurogenic claudication, lumbar radiculopathy Active Problems:   Spondylolisthesis of lumbar region   Discharged Condition: good  Hospital Course: Patient admitted by Dr. Joya Salm who performed an L4-5 lumbar decompression and arthrodesis. Patient has done well following surgery. He is up and ambulate actively. His dressing was removed, and the incision is healing nicely. He has been given instructions regarding wound care and activities following discharge. He has been asked to contact Dr. Harley Hallmark secretary after the holiday weekend to set up a follow-up appointment in a couple of weeks.  Discharge Exam: Blood pressure 118/61, pulse 89, temperature 98.9 F (37.2 C), temperature source Oral, resp. rate 18, height 5\' 8"  (1.727 m), weight 70.761 kg (156 lb), SpO2 99 %.  Disposition: 01-Home or Self Care     Medication List    TAKE these medications        Gabapentin (Once-Daily) 600 MG Tabs  Commonly known as:  GRALISE  Take 600 mg by mouth daily with supper.     HYDROcodone-acetaminophen 10-325 MG tablet  Commonly known as:  NORCO  Take 1 tablet by mouth 3 (three) times daily as needed for moderate pain.     oxyCODONE-acetaminophen 5-325 MG tablet  Commonly known as:  PERCOCET/ROXICET  Take 1-2 tablets by mouth every 4 (four) hours as needed for moderate pain.     sildenafil 100 MG tablet  Commonly known as:  VIAGRA  Take 100 mg by mouth daily as needed for erectile dysfunction.         SignedHosie Spangle 11/16/2015, 10:08 AM

## 2015-11-16 NOTE — Progress Notes (Signed)
PT Cancellation Note  Patient Details Name: LILE GO MRN: [id number] DOB: April 03, 1966   Cancelled Treatment:    Reason Eval/Treat Not Completed: Patient declined, States he is doing well, able to perform all mobility without assist, no questions.  Patient states he is 'flushed' right now and wants to rest.  Nursing reports patient is Independent.  Physician discharge note seen in system.   Zenia Resides, Ayza Ripoll L 11/16/2015, 12:16 PM

## 2016-03-21 IMAGING — CT CT ABDOMEN WO/W CM
2 of 10 series · 10 of 46 positions shown, 16 images · IV contrast (APPLIED)
Comparison: Lumbar spine CT of 02/13/2015. Report of an abdominal
pelvic CT of 11/23/2000.

CLINICAL DATA: Left renal mass suggested on lumbar spine CT.

EXAM:
CT ABDOMEN WITHOUT AND WITH CONTRAST
TECHNIQUE: Multidetector CT imaging of the abdomen was performed following the
standard protocol before and following the bolus administration of
intravenous contrast.
CONTRAST:  75 cc of Isovue 370

[Series 3: cor w/(date) · coronal · 0.47mm/px · 2 of 73 slices shown, 3 images]
[im 25/73  soft-tissue]
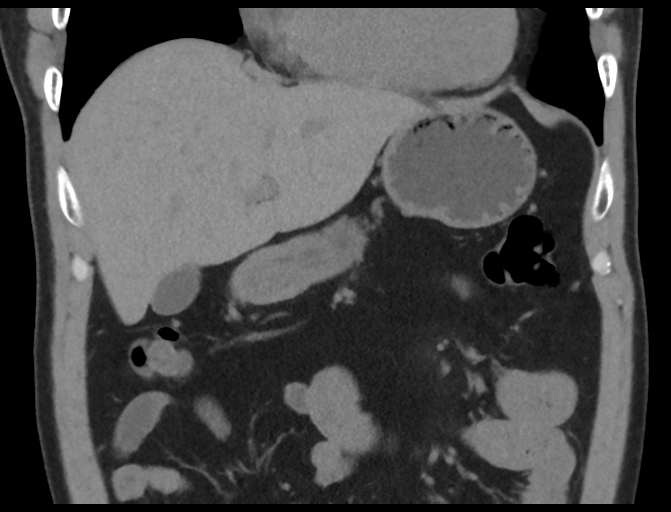
[im 25/73  bone]
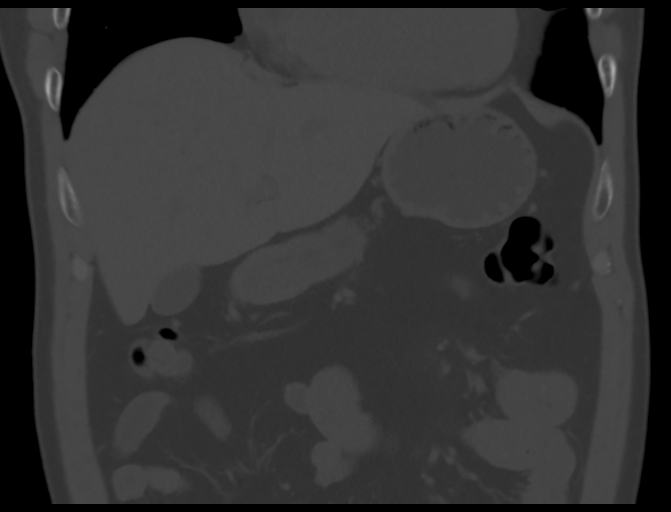
[im 49/73  soft-tissue]
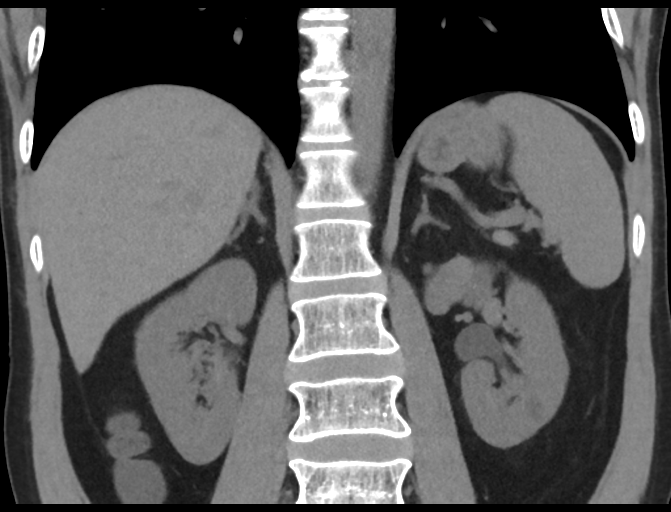

[Series 5: arterial 3.0 i41s 1 · axial · arterial · 0.66mm/px · z∈[-282,-74]mm · 8 of 89 slices shown, 13 images]
[im 10/89  soft-tissue]
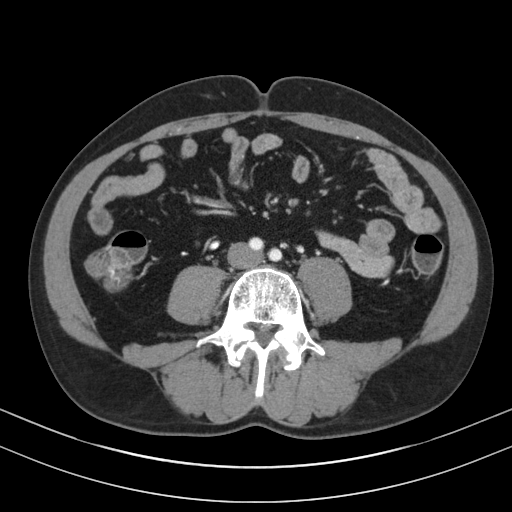
[im 10/89  bone]
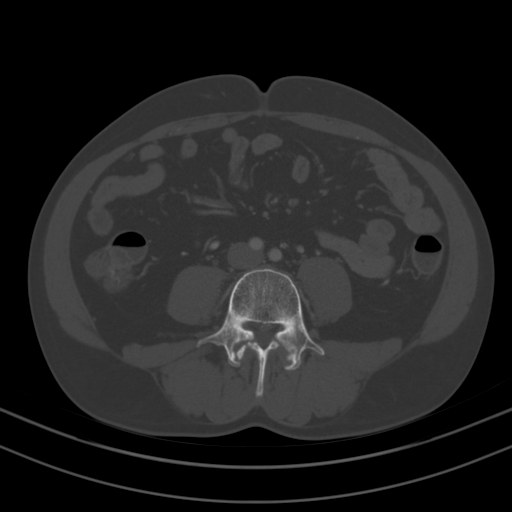
[im 20/89  soft-tissue]
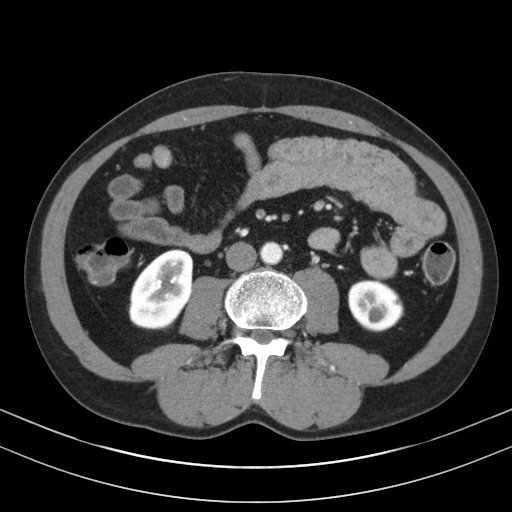
[im 30/89  soft-tissue]
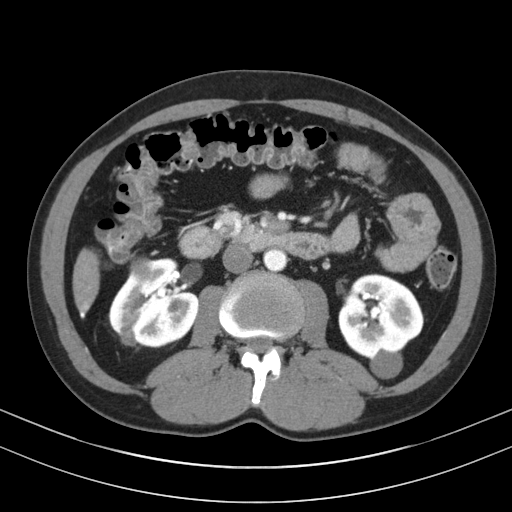
[im 40/89  soft-tissue]
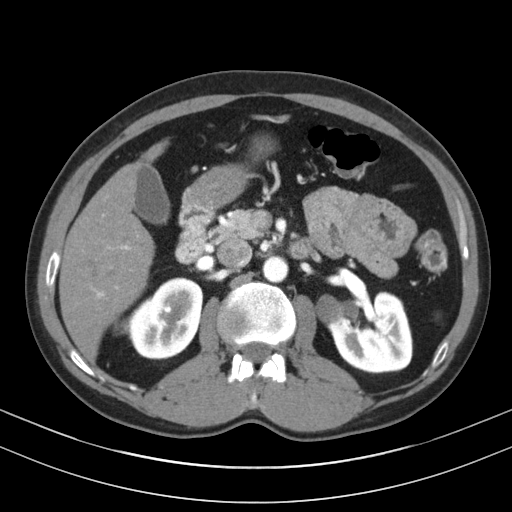
[im 49/89  soft-tissue]
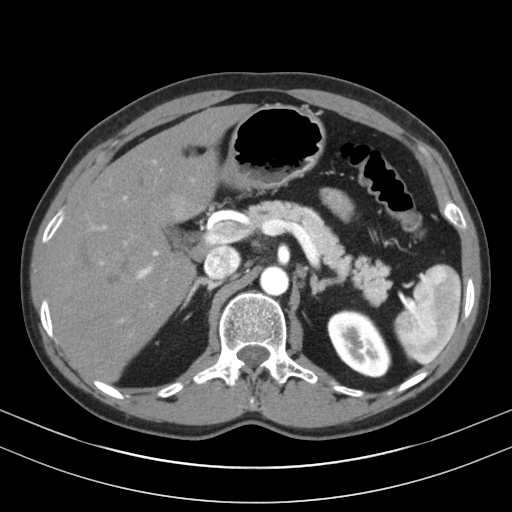
[im 49/89  lung]
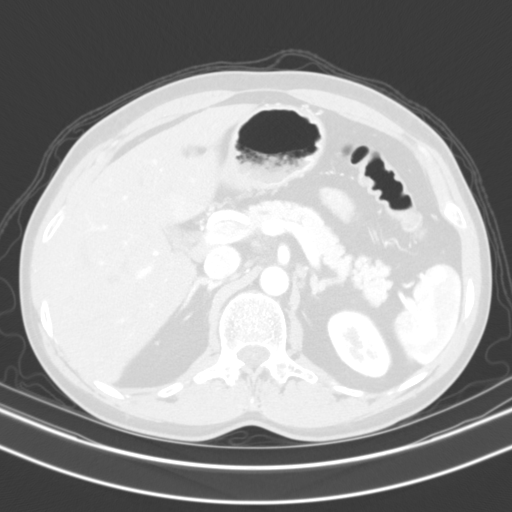
[im 59/89  soft-tissue]
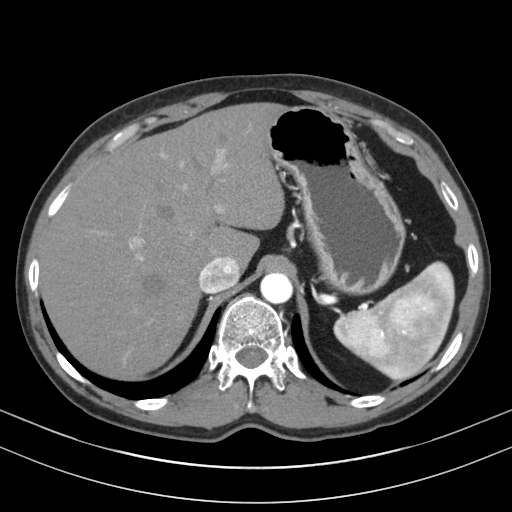
[im 59/89  lung]
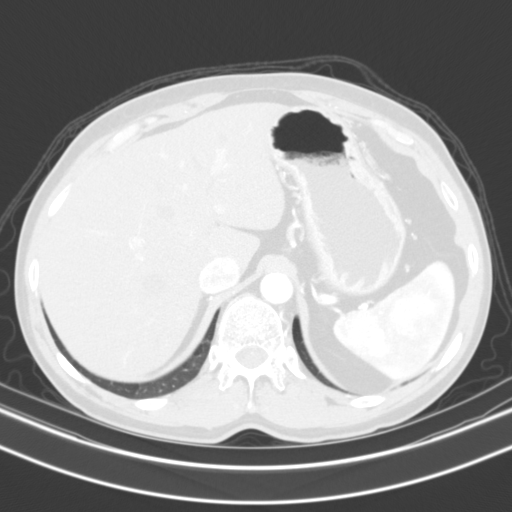
[im 69/89  soft-tissue]
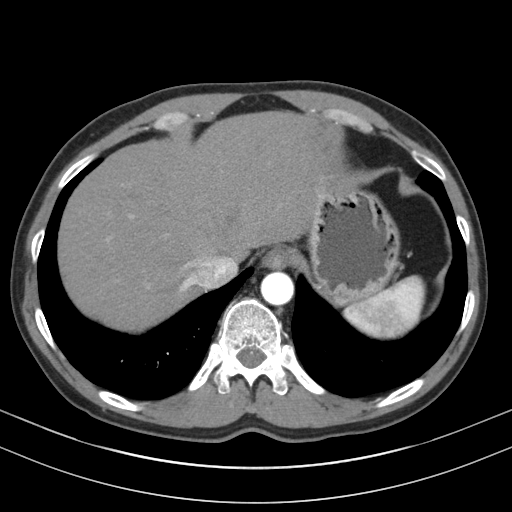
[im 69/89  lung]
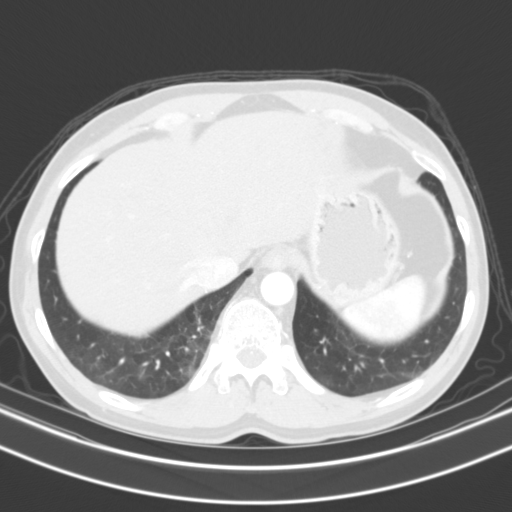
[im 79/89  soft-tissue]
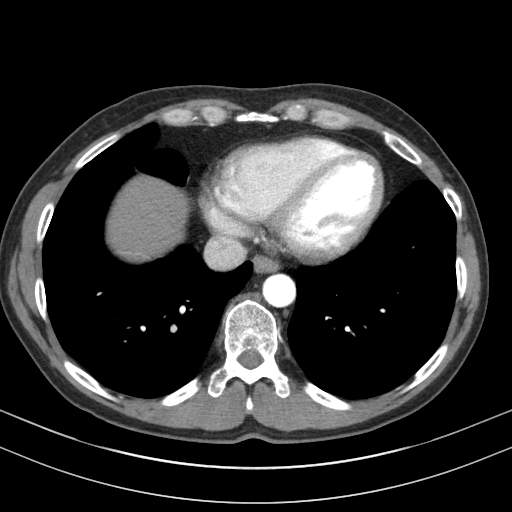
[im 79/89  lung]
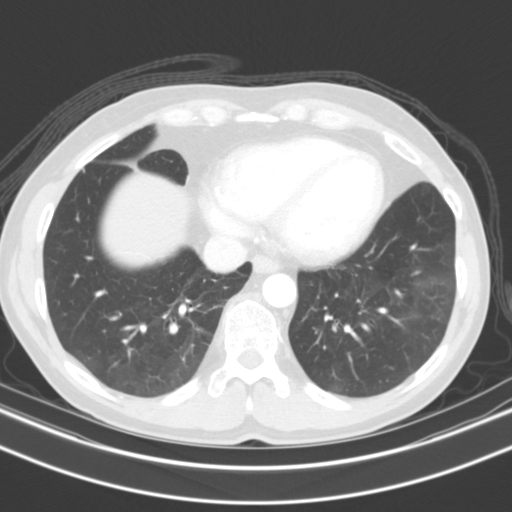

[10 of 46 positions shown; findings below may reference images not displayed]

FINDINGS: Lower chest: subsegmental atelectasis at both lung bases. Normal
heart size, without pericardial effusion.

Hepatobiliary: Vague hyper enhancement involving the tip of the
right lobe of the liver on image 61 of series 2. This is only
apparent on arterial phase imaging. Normal gallbladder, without
biliary ductal dilatation.

Pancreas: Normal, without mass or ductal dilatation.

Spleen: Normal in size, without focal abnormality.

Adrenals/Urinary Tract: Normal adrenal glands. No renal calculi or
hydronephrosis.

Multiple bilateral renal lesions. Some are simple cysts. Others are
favored to represent complex cysts.

The most suspicious right-sided lesion is in the posterior inter/
lower pole. Measures 1.3 cm on image 67 of series 6. Demonstrates
heterogeneous enhancement, including on image 66 of series 5.

Complex lesion or adjacent lesions are identified in the anterior
aspect of the upper pole left kidney. Within the lateral portion of
this area, a focus of enhancing tissue is identified measuring
cm on image 48 of series 6.

Stomach/Bowel: Normal stomach, without wall thickening. Normal
abdominal large and small bowel loops.

Vascular/Lymphatic: Celiac are narrowing on image 37 of series 5.
Prominent pancreaticoduodenal arcades are likely secondary. Example
image 46 series 5. Mild aortic atherosclerosis. Patent renal veins.
No retroperitoneal or retrocrural adenopathy.

Other: No ascites.

Musculoskeletal: Lumbar spondylosis, as detailed on the dedicated
CT.
IMPRESSION: 1. bilateral renal lesions, many of which are felt to represent
cysts and complex cysts. However, there are enhancing lesions in
both kidneys, highly suspicious for renal cell carcinoma. Given the
number of lesions and apparent complexity, further evaluation with
pre and post contrast abdominal MRI should be considered.
Alternatively, urology consultation could be performed.
2. No renal vein involvement or typical findings of metastatic
disease.
3. Focus of inferior right hepatic lobe arterial hyper enhancement
is likely a perfusion anomaly but warrants followup attention.
4. Mild but age advanced atherosclerosis. Narrowing of the celiac
origin is due to arcuate ligament compression. This is
hemodynamically significant, given enlargement of
pancreatic/duodenal arcades.
These results will be called to the ordering clinician or
representative by the Radiologist Assistant, and communication
documented in the PACS or zVision Dashboard.

## 2016-04-14 ENCOUNTER — Telehealth: Payer: Self-pay | Admitting: Neurology

## 2016-04-14 NOTE — Telephone Encounter (Signed)
I called the patient. The patient is on Gralise, it has been a year since he has been seen, the dosing could be increased, I'll get a revisit set up for him.

## 2016-04-14 NOTE — Telephone Encounter (Signed)
Patient is calling to discuss a pain in his head which has gotten worse in the past several months. He has taken Gabapentin, Once-Daily, (GRALISE) 600 MG TABS and it has not helped. Another doctor prescribed Hydrocodone but it does not seem to help either. Please call and discuss.

## 2016-04-15 NOTE — Telephone Encounter (Signed)
Appt scheduled 04/29/16.  Pt to arrive 12:00. May call back w/ questions or if needs to reschedule.

## 2016-04-29 ENCOUNTER — Ambulatory Visit (INDEPENDENT_AMBULATORY_CARE_PROVIDER_SITE_OTHER): Payer: Medicaid Other | Admitting: Neurology

## 2016-04-29 ENCOUNTER — Encounter: Payer: Self-pay | Admitting: Neurology

## 2016-04-29 VITALS — BP 140/72 | HR 72 | Resp 18 | Ht 68.0 in | Wt 158.0 lb

## 2016-04-29 DIAGNOSIS — G4489 Other headache syndrome: Secondary | ICD-10-CM | POA: Diagnosis not present

## 2016-04-29 DIAGNOSIS — Q0700 Arnold-Chiari syndrome without spina bifida or hydrocephalus: Secondary | ICD-10-CM

## 2016-04-29 MED ORDER — BACLOFEN 10 MG PO TABS: 5.0000 mg | ORAL_TABLET | Freq: Two times a day (BID) | ORAL | 3 refills | Status: DC

## 2016-04-29 NOTE — Progress Notes (Signed)
Reason for visit: Headache  Tony Richardson is an 50 y.o. male  History of present illness:  Tony Richardson is a 50 year old right-handed white male with a history of Arnold-Chiari malformation, status post suboccipital craniectomy. The patient has continued to have headaches in the back of the head, but he also has pain in the neck and shoulders, occasionally down the arms. The patient is taking hydrocodone on a regular basis, at least one tablet twice daily. He was given Gralise to take as he could not tolerate the gabapentin during the daytime, but his insurance would not cover the medication. The patient takes 400 mg of gabapentin the evening hours. The patient may have severe headaches at times, occasionally associated with some nausea and photophobia and phonophobia. He reports a sharp sensation in the back of the head at times. The patient has had lumbosacral spine surgery at the L4-5 level done by Dr. Joya Salm on 11/13/2015. This did help his low back pain. The patient has been trying to get disability unsuccessfully.  Past Medical History:  Diagnosis Date  . Arnold-Chiari deformity ([place]) 04/19/2015   lots of head pain  . Chronic back pain    spondylolisthesis  . Dizziness   . Facial numbness 04/19/2015  . History of blood transfusion    2 at birth.Issue was b/c of parents blood types per pt  . History of bronchitis    many yrs ago  . History of colon polyps    benign  . History of shingles   . Nocturia   . Pneumonia    hx of about 5-6 yrs ago  . PONV (postoperative nausea and vomiting)   . Urinary frequency    d/t prostate infection 15 yrs ago   . Weakness    numbness and tingling both hands and feet    Past Surgical History:  Procedure Laterality Date  . COLONOSCOPY    . CRANIECTOMY SUBOCCIPITAL W/ CERVICAL LAMINECTOMY / CHIARI    . ESOPHAGOGASTRODUODENOSCOPY    . growth removed from back of right ear     as a child  . SUBOCCIPITAL CRANIECTOMY CERVICAL LAMINECTOMY N/A  04/07/2013   Procedure: SUBOCCIPITAL CRANIECTOMY CERVICAL LAMINECTOMY/DURAPLASTY RESECTION POSTERIOR CERVICAL ONE;  Surgeon: Floyce Stakes, MD;  Location: [facility];  Service: Neurosurgery;  Laterality: N/A;  . THUMB ARTHROSCOPY Right 1991    Family History  Problem Relation Age of Onset  . Hypertension Brother   . Colon cancer Maternal Aunt   . Colon cancer Paternal Aunt     Social history:  reports that he has never smoked. He has never used smokeless tobacco. He reports that he drinks alcohol. He reports that he does not use drugs.    Allergies  Allergen Reactions  . Other Other (See Comments)    Per Pt needs to avoid STEROIDs products due to his eyes.     Medications:  Prior to Admission medications   Medication Sig Start Date End Date Taking? Authorizing Provider  Gabapentin, Once-Daily, (GRALISE) 600 MG TABS Take 600 mg by mouth daily with supper. Patient taking differently: Take 400 mg by mouth at bedtime.  06/11/15  Yes Kathrynn Ducking, MD  HYDROcodone-acetaminophen [facility]) 10-325 MG per tablet Take 1 tablet by mouth 3 (three) times daily as needed for moderate pain.   Yes Historical Provider, MD  hydrOXYzine (VISTARIL) 50 MG capsule Take 1 capsule (50 mg total) by mouth every 4 (four) hours as needed for nausea or vomiting. 11/16/15  Yes  Jovita Gamma, MD  sildenafil (VIAGRA) 100 MG tablet Take 100 mg by mouth daily as needed for erectile dysfunction.   Yes Historical Provider, MD    ROS:  Out of a complete 14 system review of symptoms, the patient complains only of the following symptoms, and all other reviewed systems are negative.  Chills, fatigue Difficulty swallowing, drooling Blurred vision Cough Abdominal pain Frequent waking, snoring Frequency of urination Back pain, neck pain, neck stiffness Headache, numbness Depression, anxiety  Blood pressure 140/72, pulse 72, resp. rate 18, height 5\' 8"  (1.727 m), weight 158 lb (71.7 kg).  Physical  Exam  General: The patient is alert and cooperative at the time of the examination.  Neuromuscular: Range of movement of the cervical spine is relatively full.  Skin: No significant peripheral edema is noted.   Neurologic Exam  Mental status: The patient is alert and oriented x 3 at the time of the examination. The patient has apparent normal recent and remote memory, with an apparently normal attention span and concentration ability.   Cranial nerves: Facial symmetry is present. Speech is normal, no aphasia or dysarthria is noted. Extraocular movements are full. Visual fields are full.  Motor: The patient has good strength in all 4 extremities.  Sensory examination: Soft touch sensation is symmetric on the face, arms, and legs.  Coordination: The patient has good finger-nose-finger and heel-to-shin bilaterally.  Gait and station: The patient has a normal gait. Tandem gait is normal. Romberg is negative. No drift is seen.  Reflexes: Deep tendon reflexes are symmetric.   MRI brain 04/26/15:  IMPRESSION: This MRI of the brain with and without contrast shows the following: 1. Prior occipital decompressive craniectomy. There is no mass effect on the cerebellum or brainstem. 2. Mild chronic inflammatory changes in the right maxillary sinus and some ethmoid air cells 3. There are no acute findings.   Assessment/Plan:  1. Chronic daily headache  2. Arnold-Chiari malformation  The patient is to continue the gabapentin at 400 mg at night, we will add baclofen taking 5 mg twice daily, we will increase the dose slowly if he is tolerating it, the patient will call for dose adjustments. He will follow-up otherwise in 3 months.  Jill Alexanders MD 04/29/2016 1:07 PM  [facility] [address] [place], [place] [address]  Phone [phone] Fax [phone]

## 2016-06-03 ENCOUNTER — Telehealth: Payer: Self-pay | Admitting: Neurology

## 2016-06-03 MED ORDER — BACLOFEN 10 MG PO TABS: 10.0000 mg | ORAL_TABLET | Freq: Two times a day (BID) | ORAL | 3 refills | Status: DC

## 2016-06-03 NOTE — Telephone Encounter (Signed)
I called the patient. The patient is on baclofen taking 5 mg twice daily. He is tolerating the medication, it is helping some with his headache. We will increase the dose to 10 mg twice daily. I will call in another prescription for him.

## 2016-06-03 NOTE — Addendum Note (Signed)
Addended by: Margette Fast on: 06/03/2016 11:21 AM   Modules accepted: Orders

## 2016-06-03 NOTE — Telephone Encounter (Signed)
Patient is calling to discuss increasing the dosage of baclofen (LIORESAL) 10 MG tablet. He is still having head pain.

## 2016-06-17 ENCOUNTER — Telehealth: Payer: Self-pay | Admitting: Neurology

## 2016-06-17 NOTE — Telephone Encounter (Signed)
New rx w/ increased dose (10 mg) was sent to requested pharmacy 06/03/16. Called pt to notify him to contact pharmacy for refill. May call back w/ any questions/problems.

## 2016-06-17 NOTE — Telephone Encounter (Signed)
Patient requesting refill of baclofen 10 mg twice daily. [facility] [address] in [place].  Best call back is [phone]

## 2016-07-02 DIAGNOSIS — Z0271 Encounter for disability determination: Secondary | ICD-10-CM

## 2016-07-06 ENCOUNTER — Other Ambulatory Visit: Payer: Self-pay | Admitting: Neurosurgery

## 2016-07-20 ENCOUNTER — Other Ambulatory Visit (HOSPITAL_COMMUNITY): Payer: Self-pay | Admitting: Urology

## 2016-07-20 DIAGNOSIS — D3000 Benign neoplasm of unspecified kidney: Secondary | ICD-10-CM

## 2016-07-30 ENCOUNTER — Ambulatory Visit: Payer: Medicaid Other | Admitting: Adult Health

## 2016-07-31 ENCOUNTER — Ambulatory Visit (HOSPITAL_COMMUNITY): Payer: Medicaid Other

## 2016-08-11 ENCOUNTER — Telehealth: Payer: Self-pay | Admitting: Neurology

## 2016-08-11 MED ORDER — TIZANIDINE HCL 2 MG PO TABS: 2.0000 mg | ORAL_TABLET | Freq: Three times a day (TID) | ORAL | 2 refills | Status: DC

## 2016-08-11 NOTE — Addendum Note (Signed)
Addended by: Margette Fast on: 08/11/2016 01:12 PM   Modules accepted: Orders

## 2016-08-11 NOTE — Telephone Encounter (Signed)
Dr Jannifer Franklin- please advise. Pt last saw you on 04/29/16

## 2016-08-11 NOTE — Telephone Encounter (Signed)
Patient calling stating baclofen (LIORESAL) 10 MG tablet is not helping his head pain. He has an appointment with Jinny Blossom on 09-14-16 but needs to discuss now.

## 2016-08-11 NOTE — Telephone Encounter (Signed)
The patient is on 10 mg twice daily of baclofen without much benefit, I will try converting him to tizanidine starting low-dose. He will follow-up in March.  He will go to 5 mg twice daily of the baclofen for one week, then stop.

## 2016-08-14 ENCOUNTER — Ambulatory Visit (HOSPITAL_COMMUNITY)
Admission: RE | Admit: 2016-08-14 | Discharge: 2016-08-14 | Disposition: A | Discharge status: Home / Self Care | Payer: Medicaid Other | Source: Ambulatory Visit | Attending: Urology | Admitting: Urology

## 2016-08-14 DIAGNOSIS — N281 Cyst of kidney, acquired: Secondary | ICD-10-CM | POA: Insufficient documentation

## 2016-08-14 DIAGNOSIS — D3000 Benign neoplasm of unspecified kidney: Secondary | ICD-10-CM | POA: Diagnosis present

## 2016-08-14 MED ORDER — GADOBENATE DIMEGLUMINE 529 MG/ML IV SOLN
15.0000 mL | Freq: Once | INTRAVENOUS | Status: AC | PRN
  Administered 2016-08-14: 15 mL via INTRAVENOUS

## 2016-08-17 ENCOUNTER — Encounter (HOSPITAL_COMMUNITY): Payer: Self-pay

## 2016-08-17 ENCOUNTER — Encounter (HOSPITAL_COMMUNITY)
Admission: RE | Admit: 2016-08-17 | Discharge: 2016-08-17 | Disposition: A | Discharge status: Home / Self Care | Payer: Medicaid Other | Source: Ambulatory Visit | Attending: Neurosurgery | Admitting: Neurosurgery

## 2016-08-17 DIAGNOSIS — Z01812 Encounter for preprocedural laboratory examination: Secondary | ICD-10-CM | POA: Diagnosis present

## 2016-08-17 HISTORY — DX: Anxiety disorder, unspecified: F41.9

## 2016-08-17 HISTORY — DX: Major depressive disorder, single episode, unspecified: F32.9

## 2016-08-17 HISTORY — DX: Depression, unspecified: F32.A

## 2016-08-17 HISTORY — DX: Unspecified osteoarthritis, unspecified site: M19.90

## 2016-08-17 LAB — SURGICAL PCR SCREEN
MRSA, PCR: NEGATIVE
STAPHYLOCOCCUS AUREUS: NEGATIVE

## 2016-08-17 LAB — CBC
HCT: 41.7 % (ref 39.0–52.0)
Hemoglobin: 13.8 g/dL (ref 13.0–17.0)
MCH: 29.2 pg (ref 26.0–34.0)
MCHC: 33.1 g/dL (ref 30.0–36.0)
MCV: 88.3 fL (ref 78.0–100.0)
PLATELETS: 174 10*3/uL (ref 150–400)
RBC: 4.72 MIL/uL (ref 4.22–5.81)
RDW: 13.6 % (ref 11.5–15.5)
WBC: 8.8 10*3/uL (ref 4.0–10.5)

## 2016-08-17 MED ORDER — CHLORHEXIDINE GLUCONATE CLOTH 2 % EX PADS: 6.0000 | MEDICATED_PAD | Freq: Once | CUTANEOUS | Status: DC

## 2016-08-17 NOTE — Pre-Procedure Instructions (Signed)
    Tony Richardson  08/17/2016      [id number] - [place], [place] - [address] [place] [address] Phone: [phone] Fax: [phone]  [facility] - [place], [place] - [address] AT [place] [address] [place] [address] Phone: [phone] Fax: [phone]    Your procedure is scheduled on Mon. March 5 @730  AM.  Report to [facility] at 530 A.M.  Call this number if you have problems the morning of surgery:  [phone]   Remember:  Do not eat food or drink liquids after midnight.  Take these medicines the morning of surgery with A SIP OF WATER gabapentin (neurontin), hydrocodone-acetaminophen (norco), hydroxyzine (vistaril), tizanidine (zanaflex), trazodone(desyrel).   Do not wear jewelry, make-up or nail polish.  Do not wear lotions, powders, or perfumes, or deoderant.  Do not shave 48 hours prior to surgery.  Men may shave face and neck.  Do not bring valuables to the hospital.  [facility] is not responsible for any belongings or valuables.  Contacts, dentures or bridgework may not be worn into surgery.  Leave your suitcase in the car.  After surgery it may be brought to your room.  For patients admitted to the hospital, discharge time will be determined by your treatment team.  Patients discharged the day of surgery will not be allowed to drive home.   Special instructions: see attached  Please read over the following fact sheets that you were given. Pain Booklet, Coughing and Deep Breathing, MRSA Information and Surgical Site Infection Prevention

## 2016-08-17 NOTE — Progress Notes (Addendum)
PCP: Dr. Lona Kettle  Cardiologist: pt denies  [id number] in EPIC, pt denies in past year  Stress test: pt denies  ECHO: pt denies  Cardiac Cath: pt denies  Chest x-ray: denies past year

## 2016-09-02 DIAGNOSIS — Z0271 Encounter for disability determination: Secondary | ICD-10-CM

## 2016-09-03 ENCOUNTER — Encounter (HOSPITAL_COMMUNITY): Payer: Self-pay | Admitting: *Deleted

## 2016-09-03 NOTE — Progress Notes (Signed)
Spoke with pt's wife Margaretha Sheffield for pre-op call. Pt was seen here on 2/2/6 for PAT and surgery was moved to Monday, 09/07/16. She states pt has has no changes to his allergies, medications, medical and surgical history. Denies any recent chest pain or sob.

## 2016-09-04 ENCOUNTER — Other Ambulatory Visit: Payer: Self-pay | Admitting: Family Medicine

## 2016-09-04 DIAGNOSIS — E041 Nontoxic single thyroid nodule: Secondary | ICD-10-CM

## 2016-09-07 ENCOUNTER — Encounter (HOSPITAL_COMMUNITY)
Admission: RE | Disposition: A | Discharge status: Home / Self Care | Payer: Self-pay | Source: Ambulatory Visit | Attending: Neurosurgery

## 2016-09-07 ENCOUNTER — Encounter (HOSPITAL_COMMUNITY): Payer: Self-pay | Admitting: Anesthesiology

## 2016-09-07 ENCOUNTER — Inpatient Hospital Stay (HOSPITAL_COMMUNITY)
Admission: RE | Admit: 2016-09-07 | Discharge: 2016-09-08 | DRG: 473 | Disposition: A | Discharge status: Home / Self Care | Payer: Medicaid Other | Source: Ambulatory Visit | Attending: Neurosurgery | Admitting: Neurosurgery

## 2016-09-07 ENCOUNTER — Inpatient Hospital Stay (HOSPITAL_COMMUNITY): Payer: Medicaid Other

## 2016-09-07 ENCOUNTER — Inpatient Hospital Stay (HOSPITAL_COMMUNITY): Payer: Medicaid Other | Admitting: Certified Registered"

## 2016-09-07 DIAGNOSIS — M50122 Cervical disc disorder at C5-C6 level with radiculopathy: Secondary | ICD-10-CM | POA: Diagnosis present

## 2016-09-07 DIAGNOSIS — M50123 Cervical disc disorder at C6-C7 level with radiculopathy: Principal | ICD-10-CM | POA: Diagnosis present

## 2016-09-07 DIAGNOSIS — M4722 Other spondylosis with radiculopathy, cervical region: Secondary | ICD-10-CM | POA: Diagnosis present

## 2016-09-07 DIAGNOSIS — Z888 Allergy status to other drugs, medicaments and biological substances status: Secondary | ICD-10-CM | POA: Diagnosis not present

## 2016-09-07 DIAGNOSIS — Z79899 Other long term (current) drug therapy: Secondary | ICD-10-CM | POA: Diagnosis not present

## 2016-09-07 DIAGNOSIS — Z419 Encounter for procedure for purposes other than remedying health state, unspecified: Secondary | ICD-10-CM

## 2016-09-07 DIAGNOSIS — G8929 Other chronic pain: Secondary | ICD-10-CM | POA: Diagnosis present

## 2016-09-07 DIAGNOSIS — M5020 Other cervical disc displacement, unspecified cervical region: Secondary | ICD-10-CM | POA: Diagnosis present

## 2016-09-07 DIAGNOSIS — F329 Major depressive disorder, single episode, unspecified: Secondary | ICD-10-CM | POA: Diagnosis present

## 2016-09-07 HISTORY — PX: ANTERIOR CERVICAL DECOMP/DISCECTOMY FUSION: SHX1161

## 2016-09-07 LAB — CBC
HCT: 40.1 % (ref 39.0–52.0)
Hemoglobin: 13.2 g/dL (ref 13.0–17.0)
MCH: 28.8 pg (ref 26.0–34.0)
MCHC: 32.9 g/dL (ref 30.0–36.0)
MCV: 87.6 fL (ref 78.0–100.0)
PLATELETS: 171 10*3/uL (ref 150–400)
RBC: 4.58 MIL/uL (ref 4.22–5.81)
RDW: 13.7 % (ref 11.5–15.5)
WBC: 8.8 10*3/uL (ref 4.0–10.5)

## 2016-09-07 SURGERY — ANTERIOR CERVICAL DECOMPRESSION/DISCECTOMY FUSION 2 LEVELS
Anesthesia: General

## 2016-09-07 MED ORDER — KETOROLAC TROMETHAMINE 30 MG/ML IJ SOLN
INTRAMUSCULAR | Status: AC
  Filled 2016-09-07: qty 1

## 2016-09-07 MED ORDER — THROMBIN 20000 UNITS EX SOLR
CUTANEOUS | Status: DC | PRN
  Administered 2016-09-07: 11:00:00 via TOPICAL

## 2016-09-07 MED ORDER — ROCURONIUM BROMIDE 50 MG/5ML IV SOSY
PREFILLED_SYRINGE | INTRAVENOUS | Status: AC
  Filled 2016-09-07: qty 5

## 2016-09-07 MED ORDER — HYDROXYZINE HCL 50 MG/ML IM SOLN
50.0000 mg | INTRAMUSCULAR | Status: DC | PRN
  Administered 2016-09-07: 50 mg via INTRAMUSCULAR
  Filled 2016-09-07: qty 1

## 2016-09-07 MED ORDER — ROCURONIUM BROMIDE 100 MG/10ML IV SOLN
INTRAVENOUS | Status: DC | PRN
  Administered 2016-09-07: 50 mg via INTRAVENOUS

## 2016-09-07 MED ORDER — KETOROLAC TROMETHAMINE 30 MG/ML IJ SOLN
30.0000 mg | Freq: Four times a day (QID) | INTRAMUSCULAR | Status: DC
  Administered 2016-09-07 – 2016-09-08 (×3): 30 mg via INTRAVENOUS
  Filled 2016-09-07 (×3): qty 1

## 2016-09-07 MED ORDER — SUCCINYLCHOLINE CHLORIDE 20 MG/ML IJ SOLN
INTRAMUSCULAR | Status: DC | PRN
  Administered 2016-09-07: 120 mg via INTRAVENOUS

## 2016-09-07 MED ORDER — ACETAMINOPHEN 10 MG/ML IV SOLN
INTRAVENOUS | Status: DC | PRN
  Administered 2016-09-07: 1000 mg via INTRAVENOUS

## 2016-09-07 MED ORDER — HYDROXYZINE HCL 25 MG PO TABS: 50.0000 mg | ORAL_TABLET | ORAL | Status: DC | PRN

## 2016-09-07 MED ORDER — MORPHINE SULFATE (PF) 4 MG/ML IV SOLN: 4.0000 mg | INTRAVENOUS | Status: DC | PRN

## 2016-09-07 MED ORDER — 0.9 % SODIUM CHLORIDE (POUR BTL) OPTIME
TOPICAL | Status: DC | PRN
  Administered 2016-09-07: 1000 mL

## 2016-09-07 MED ORDER — KETOROLAC TROMETHAMINE 15 MG/ML IJ SOLN: 15.0000 mg | Freq: Once | INTRAMUSCULAR | Status: DC

## 2016-09-07 MED ORDER — LACTATED RINGERS IV SOLN
INTRAVENOUS | Status: DC
  Administered 2016-09-07 (×3): via INTRAVENOUS

## 2016-09-07 MED ORDER — FLEET ENEMA 7-19 GM/118ML RE ENEM: 1.0000 | ENEMA | Freq: Once | RECTAL | Status: DC | PRN

## 2016-09-07 MED ORDER — FENTANYL CITRATE (PF) 100 MCG/2ML IJ SOLN
INTRAMUSCULAR | Status: AC
  Filled 2016-09-07: qty 2

## 2016-09-07 MED ORDER — TIZANIDINE HCL 2 MG PO TABS
2.0000 mg | ORAL_TABLET | Freq: Three times a day (TID) | ORAL | Status: DC
  Administered 2016-09-07 – 2016-09-08 (×2): 2 mg via ORAL
  Filled 2016-09-07 (×3): qty 1

## 2016-09-07 MED ORDER — TRAZODONE HCL 50 MG PO TABS
50.0000 mg | ORAL_TABLET | Freq: Every evening | ORAL | Status: DC | PRN
Start: 2016-09-07 — End: 2016-09-08
  Administered 2016-09-07: 50 mg via ORAL
  Filled 2016-09-07 (×2): qty 1

## 2016-09-07 MED ORDER — HYDROCODONE-ACETAMINOPHEN 5-325 MG PO TABS
1.0000 | ORAL_TABLET | ORAL | Status: DC | PRN
  Administered 2016-09-07 – 2016-09-08 (×5): 2 via ORAL
  Filled 2016-09-07 (×5): qty 2

## 2016-09-07 MED ORDER — ALUM & MAG HYDROXIDE-SIMETH 200-200-20 MG/5ML PO SUSP
30.0000 mL | Freq: Four times a day (QID) | ORAL | Status: DC | PRN
  Filled 2016-09-07: qty 30

## 2016-09-07 MED ORDER — ONDANSETRON HCL 4 MG/2ML IJ SOLN
INTRAMUSCULAR | Status: DC | PRN
  Administered 2016-09-07: 4 mg via INTRAVENOUS

## 2016-09-07 MED ORDER — SUGAMMADEX SODIUM 200 MG/2ML IV SOLN
INTRAVENOUS | Status: DC | PRN
  Administered 2016-09-07: 150 mg via INTRAVENOUS

## 2016-09-07 MED ORDER — SODIUM CHLORIDE 0.9% FLUSH: 3.0000 mL | INTRAVENOUS | Status: DC | PRN

## 2016-09-07 MED ORDER — ACETAMINOPHEN 650 MG RE SUPP: 650.0000 mg | RECTAL | Status: DC | PRN

## 2016-09-07 MED ORDER — SUCCINYLCHOLINE CHLORIDE 200 MG/10ML IV SOSY
PREFILLED_SYRINGE | INTRAVENOUS | Status: AC
  Filled 2016-09-07: qty 10

## 2016-09-07 MED ORDER — FENTANYL CITRATE (PF) 100 MCG/2ML IJ SOLN
INTRAMUSCULAR | Status: AC
  Filled 2016-09-07: qty 4

## 2016-09-07 MED ORDER — ACETAMINOPHEN 10 MG/ML IV SOLN
INTRAVENOUS | Status: AC
  Filled 2016-09-07: qty 100

## 2016-09-07 MED ORDER — MIDAZOLAM HCL 2 MG/2ML IJ SOLN
INTRAMUSCULAR | Status: AC
  Filled 2016-09-07: qty 2

## 2016-09-07 MED ORDER — MAGNESIUM HYDROXIDE 400 MG/5ML PO SUSP: 30.0000 mL | Freq: Every day | ORAL | Status: DC | PRN

## 2016-09-07 MED ORDER — FENTANYL CITRATE (PF) 100 MCG/2ML IJ SOLN
INTRAMUSCULAR | Status: DC | PRN
  Administered 2016-09-07: 150 ug via INTRAVENOUS
  Administered 2016-09-07 (×2): 100 ug via INTRAVENOUS
  Administered 2016-09-07: 50 ug via INTRAVENOUS

## 2016-09-07 MED ORDER — KETOROLAC TROMETHAMINE 15 MG/ML IJ SOLN
INTRAMUSCULAR | Status: AC
  Filled 2016-09-07: qty 1

## 2016-09-07 MED ORDER — LIDOCAINE-EPINEPHRINE 2 %-1:100000 IJ SOLN
INTRAMUSCULAR | Status: AC
  Filled 2016-09-07: qty 1

## 2016-09-07 MED ORDER — CYCLOBENZAPRINE HCL 5 MG PO TABS: 5.0000 mg | ORAL_TABLET | Freq: Three times a day (TID) | ORAL | Status: DC | PRN

## 2016-09-07 MED ORDER — GABAPENTIN 400 MG PO CAPS
400.0000 mg | ORAL_CAPSULE | Freq: Every day | ORAL | Status: DC
  Administered 2016-09-07: 400 mg via ORAL
  Filled 2016-09-07: qty 1

## 2016-09-07 MED ORDER — PHENOL 1.4 % MT LIQD: 1.0000 | OROMUCOSAL | Status: DC | PRN

## 2016-09-07 MED ORDER — GELATIN ABSORBABLE MT POWD
OROMUCOSAL | Status: DC | PRN
  Administered 2016-09-07: 11:00:00 via TOPICAL

## 2016-09-07 MED ORDER — PROPOFOL 10 MG/ML IV BOLUS
INTRAVENOUS | Status: DC | PRN
  Administered 2016-09-07: 200 mg via INTRAVENOUS

## 2016-09-07 MED ORDER — ONDANSETRON HCL 4 MG/2ML IJ SOLN: 4.0000 mg | Freq: Four times a day (QID) | INTRAMUSCULAR | Status: DC | PRN

## 2016-09-07 MED ORDER — SCOPOLAMINE 1 MG/3DAYS TD PT72
MEDICATED_PATCH | TRANSDERMAL | Status: AC
  Filled 2016-09-07: qty 1

## 2016-09-07 MED ORDER — CEFAZOLIN SODIUM-DEXTROSE 2-4 GM/100ML-% IV SOLN
2.0000 g | INTRAVENOUS | Status: AC
  Administered 2016-09-07: 2 g via INTRAVENOUS
  Filled 2016-09-07: qty 100

## 2016-09-07 MED ORDER — THROMBIN 5000 UNITS EX SOLR
CUTANEOUS | Status: AC
  Filled 2016-09-07: qty 10000

## 2016-09-07 MED ORDER — ONDANSETRON HCL 4 MG PO TABS: 4.0000 mg | ORAL_TABLET | Freq: Four times a day (QID) | ORAL | Status: DC | PRN

## 2016-09-07 MED ORDER — SCOPOLAMINE 1 MG/3DAYS TD PT72
MEDICATED_PATCH | TRANSDERMAL | Status: DC | PRN
  Administered 2016-09-07: 1 via TRANSDERMAL

## 2016-09-07 MED ORDER — HYDROMORPHONE HCL 1 MG/ML IJ SOLN: 0.2500 mg | INTRAMUSCULAR | Status: DC | PRN

## 2016-09-07 MED ORDER — SODIUM CHLORIDE 0.9 % IR SOLN
Status: DC | PRN
  Administered 2016-09-07: 11:00:00

## 2016-09-07 MED ORDER — MENTHOL 3 MG MT LOZG: 1.0000 | LOZENGE | OROMUCOSAL | Status: DC | PRN

## 2016-09-07 MED ORDER — KCL IN DEXTROSE-NACL 20-5-0.45 MEQ/L-%-% IV SOLN: INTRAVENOUS | Status: DC

## 2016-09-07 MED ORDER — SODIUM CHLORIDE 0.9% FLUSH
3.0000 mL | Freq: Two times a day (BID) | INTRAVENOUS | Status: DC
  Administered 2016-09-07: 3 mL via INTRAVENOUS

## 2016-09-07 MED ORDER — MIDAZOLAM HCL 5 MG/5ML IJ SOLN
INTRAMUSCULAR | Status: DC | PRN
  Administered 2016-09-07: 2 mg via INTRAVENOUS

## 2016-09-07 MED ORDER — LIDOCAINE-EPINEPHRINE 2 %-1:100000 IJ SOLN
INTRAMUSCULAR | Status: DC | PRN
  Administered 2016-09-07: 6 mL via INTRADERMAL

## 2016-09-07 MED ORDER — BISACODYL 10 MG RE SUPP: 10.0000 mg | Freq: Every day | RECTAL | Status: DC | PRN

## 2016-09-07 MED ORDER — KETOROLAC TROMETHAMINE 30 MG/ML IJ SOLN
30.0000 mg | Freq: Once | INTRAMUSCULAR | Status: AC
  Administered 2016-09-07: 30 mg via INTRAVENOUS

## 2016-09-07 MED ORDER — LIDOCAINE HCL (CARDIAC) 20 MG/ML IV SOLN
INTRAVENOUS | Status: DC | PRN
  Administered 2016-09-07: 100 mg via INTRAVENOUS

## 2016-09-07 MED ORDER — BUPIVACAINE HCL (PF) 0.5 % IJ SOLN
INTRAMUSCULAR | Status: DC | PRN
  Administered 2016-09-07: 6 mL

## 2016-09-07 MED ORDER — PROPOFOL 10 MG/ML IV BOLUS
INTRAVENOUS | Status: AC
  Filled 2016-09-07: qty 20

## 2016-09-07 MED ORDER — ACETAMINOPHEN 325 MG PO TABS
650.0000 mg | ORAL_TABLET | ORAL | Status: DC | PRN
Start: 2016-09-07 — End: 2016-09-08

## 2016-09-07 MED ORDER — THROMBIN 20000 UNITS EX SOLR
CUTANEOUS | Status: AC
  Filled 2016-09-07: qty 20000

## 2016-09-07 SURGICAL SUPPLY — 54 items
ALLOGRAFT 7X14X11 (Bone Implant) ×4 IMPLANT
BAG DECANTER FOR FLEXI CONT (MISCELLANEOUS) ×2 IMPLANT
BIT DRILL 14X2.5XNS TI ANT (BIT) ×1 IMPLANT
BIT DRILL AVIATOR 14 (BIT) ×1
BIT DRILL NEURO 2X3.1 SFT TUCH (MISCELLANEOUS) ×1 IMPLANT
BIT DRL 14X2.5XNS TI ANT (BIT) ×1
BLADE ULTRA TIP 2M (BLADE) ×2 IMPLANT
CANISTER SUCT 3000ML PPV (MISCELLANEOUS) ×2 IMPLANT
CARTRIDGE OIL MAESTRO DRILL (MISCELLANEOUS) ×1 IMPLANT
COVER MAYO STAND STRL (DRAPES) ×2 IMPLANT
DECANTER SPIKE VIAL GLASS SM (MISCELLANEOUS) ×2 IMPLANT
DERMABOND ADVANCED (GAUZE/BANDAGES/DRESSINGS) ×1
DERMABOND ADVANCED .7 DNX12 (GAUZE/BANDAGES/DRESSINGS) ×1 IMPLANT
DIFFUSER DRILL AIR PNEUMATIC (MISCELLANEOUS) ×2 IMPLANT
DRAPE HALF SHEET 40X57 (DRAPES) IMPLANT
DRAPE LAPAROTOMY 100X72 PEDS (DRAPES) ×2 IMPLANT
DRAPE MICROSCOPE LEICA (MISCELLANEOUS) ×2 IMPLANT
DRAPE POUCH INSTRU U-SHP 10X18 (DRAPES) ×2 IMPLANT
DRILL NEURO 2X3.1 SOFT TOUCH (MISCELLANEOUS) ×2
ELECT COATED BLADE 2.86 ST (ELECTRODE) ×2 IMPLANT
ELECT REM PT RETURN 9FT ADLT (ELECTROSURGICAL) ×2
ELECTRODE REM PT RTRN 9FT ADLT (ELECTROSURGICAL) ×1 IMPLANT
GLOVE BIOGEL PI IND STRL 8 (GLOVE) ×1 IMPLANT
GLOVE BIOGEL PI INDICATOR 8 (GLOVE) ×1
GLOVE ECLIPSE 7.5 STRL STRAW (GLOVE) ×2 IMPLANT
GLOVE EXAM NITRILE LRG STRL (GLOVE) IMPLANT
GLOVE EXAM NITRILE XL STR (GLOVE) IMPLANT
GLOVE EXAM NITRILE XS STR PU (GLOVE) IMPLANT
GOWN STRL REUS W/ TWL LRG LVL3 (GOWN DISPOSABLE) IMPLANT
GOWN STRL REUS W/ TWL XL LVL3 (GOWN DISPOSABLE) IMPLANT
GOWN STRL REUS W/TWL 2XL LVL3 (GOWN DISPOSABLE) IMPLANT
GOWN STRL REUS W/TWL LRG LVL3 (GOWN DISPOSABLE)
GOWN STRL REUS W/TWL XL LVL3 (GOWN DISPOSABLE)
HALTER HD/CHIN CERV TRACTION D (MISCELLANEOUS) ×2 IMPLANT
HEMOSTAT POWDER KIT SURGIFOAM (HEMOSTASIS) ×2 IMPLANT
KIT BASIN OR (CUSTOM PROCEDURE TRAY) ×2 IMPLANT
KIT ROOM TURNOVER OR (KITS) ×2 IMPLANT
NEEDLE HYPO 25X1 1.5 SAFETY (NEEDLE) ×2 IMPLANT
NEEDLE SPNL 22GX3.5 QUINCKE BK (NEEDLE) ×4 IMPLANT
NS IRRIG 1000ML POUR BTL (IV SOLUTION) ×2 IMPLANT
OIL CARTRIDGE MAESTRO DRILL (MISCELLANEOUS) ×2
PACK LAMINECTOMY NEURO (CUSTOM PROCEDURE TRAY) ×2 IMPLANT
PAD ARMBOARD 7.5X6 YLW CONV (MISCELLANEOUS) ×6 IMPLANT
PLATE AVIATOR ASSY 2LVL SZ 32 (Plate) ×2 IMPLANT
RUBBERBAND STERILE (MISCELLANEOUS) ×4 IMPLANT
SCREW AVIATOR VAR SELFTAP 4X14 (Screw) ×12 IMPLANT
SPONGE INTESTINAL PEANUT (DISPOSABLE) ×2 IMPLANT
SPONGE SURGIFOAM ABS GEL 100 (HEMOSTASIS) ×2 IMPLANT
STAPLER SKIN PROX WIDE 3.9 (STAPLE) IMPLANT
SUT VIC AB 2-0 CP2 18 (SUTURE) ×2 IMPLANT
SUT VIC AB 3-0 SH 8-18 (SUTURE) ×4 IMPLANT
TOWEL GREEN STERILE (TOWEL DISPOSABLE) ×2 IMPLANT
TOWEL GREEN STERILE FF (TOWEL DISPOSABLE) ×2 IMPLANT
WATER STERILE IRR 1000ML POUR (IV SOLUTION) ×2 IMPLANT

## 2016-09-07 NOTE — H&P (Signed)
Subjective: Patient is a 51 y.o. right-handed white male who is admitted for treatment of spondylitic disc herniations at the C5-6 and C6-7 levels.  Symptomatically patient's been having difficulties with neck pain and bilateral cervical radiculopathy, right worse than left. He has advanced degenerative disc disease and spondylosis, particularly at the C5-6 and C6-7 levels with superimposed spondylitic disc herniations, with bilateral osteophytic neural foraminal encroachment, right worse than left at C5-6 and left worse than right at C6-7. Patient admitted now for 2 level C5-6 and C6-7 anterior cervical decompression and arthrodesis with structural allograft and cervical plating.    Patient Active Problem List   Diagnosis Date Noted  . Headache syndrome 04/29/2016  . Spondylolisthesis of lumbar region 11/13/2015  . Facial numbness 04/19/2015  . Arnold-Chiari deformity ([place]) 04/19/2015  . Renal lesion   . Leukocytosis, unspecified 04/18/2013  . Photophobia 04/15/2013  . Nausea & vomiting 04/15/2013  . Constipation 04/15/2013  . UKGURKYH(062.3)    Past Medical History:  Diagnosis Date  . Anxiety   . Arnold-Chiari deformity ([place]) 04/19/2015   lots of head pain  . Arthritis   . Chronic back pain    spondylolisthesis  . Depression   . Dizziness   . Facial numbness 04/19/2015  . History of blood transfusion    2 at birth.Issue was b/c of parents blood types per pt  . History of bronchitis    many yrs ago  . History of colon polyps    benign  . History of shingles   . Nocturia   . Pneumonia    hx of about 5-6 yrs ago  . PONV (postoperative nausea and vomiting)   . Urinary frequency    d/t prostate infection 15 yrs ago   . Weakness    numbness and tingling both hands and feet    Past Surgical History:  Procedure Laterality Date  . COLONOSCOPY    . CRANIECTOMY SUBOCCIPITAL W/ CERVICAL LAMINECTOMY / CHIARI    . ESOPHAGOGASTRODUODENOSCOPY    . growth removed from back of right  ear     as a child  . SUBOCCIPITAL CRANIECTOMY CERVICAL LAMINECTOMY N/A 04/07/2013   Procedure: SUBOCCIPITAL CRANIECTOMY CERVICAL LAMINECTOMY/DURAPLASTY RESECTION POSTERIOR CERVICAL ONE;  Surgeon: Floyce Stakes, MD;  Location: [facility];  Service: Neurosurgery;  Laterality: N/A;  . THUMB ARTHROSCOPY Right 1991    Prescriptions Prior to Admission  Medication Sig Dispense Refill Last Dose  . gabapentin (NEURONTIN) 400 MG capsule Take 400 mg by mouth at bedtime.   09/06/2016 at Unknown time  . HYDROcodone-acetaminophen (NORCO) 10-325 MG per tablet Take 1 tablet by mouth 2 (two) times daily as needed for moderate pain.    09/07/2016 at 0600  . tiZANidine (ZANAFLEX) 2 MG tablet Take 1 tablet (2 mg total) by mouth 3 (three) times daily. 90 tablet 2 09/06/2016 at Unknown time  . hydrOXYzine (VISTARIL) 50 MG capsule Take 1 capsule (50 mg total) by mouth every 4 (four) hours as needed for nausea or vomiting. 30 capsule 0 Unknown at Unknown time  . sildenafil (VIAGRA) 100 MG tablet Take 100 mg by mouth daily as needed for erectile dysfunction.   More than a month at Unknown time  . traZODone (DESYREL) 50 MG tablet Take 50 mg by mouth at bedtime as needed for sleep.   Unknown at Unknown time   Allergies  Allergen Reactions  . Other Other (See Comments)    Per Pt needs to avoid STEROIDs products due to his eyes.  UNSPECIFIED  REACTION     Social History  Substance Use Topics  . Smoking status: Never Smoker  . Smokeless tobacco: Never Used  . Alcohol use No    Family History  Problem Relation Age of Onset  . Hypertension Brother   . Colon cancer Maternal Aunt   . Colon cancer Paternal Aunt      Review of Systems A comprehensive review of systems was negative.  Objective: Vital signs in last 24 hours: Temp:  [98 F (36.7 C)] 98 F (36.7 C) (03/19 0820) Pulse Rate:  [61] 61 (03/19 0820) Resp:  [12] 12 (03/19 0820) BP: (120)/(78) 120/78 (03/19 0820) SpO2:  [96 %] 96 % (03/19  0820) Weight:  [66.7 kg (147 lb)] 66.7 kg (147 lb) (03/19 0806)  EXAM: Patient is a well-developed well-nourished white male in no acute distress. Lungs are clear to auscultation , the patient has symmetrical respiratory excursion. Heart has a regular rate and rhythm normal S1 and S2 no murmur.   Abdomen is soft nontender nondistended bowel sounds are present. Extremity examination shows no clubbing cyanosis or edema. Motor examination shows 5 over 5 strength in the upper extremities including the deltoid biceps triceps and intrinsics and grip. Sensation is intact to pinprick throughout the digits of the upper extremities. Reflexes are symmetrical and without evidence of pathologic reflexes. Patient has a normal gait and stance.   Data Review:CBC    Component Value Date/Time   WBC 8.8 09/07/2016 0757   RBC 4.58 09/07/2016 0757   HGB 13.2 09/07/2016 0757   HCT 40.1 09/07/2016 0757   PLT 171 09/07/2016 0757   MCV 87.6 09/07/2016 0757   MCH 28.8 09/07/2016 0757   MCHC 32.9 09/07/2016 0757   RDW 13.7 09/07/2016 0757   LYMPHSABS 4.6 (H) 04/15/2013 1436   MONOABS 3.0 (H) 04/15/2013 1436   EOSABS 0.3 04/15/2013 1436   BASOSABS 0.0 04/15/2013 1436                          BMET    Component Value Date/Time   NA 140 11/04/2015 1127   NA 143 04/19/2015 1017   K 4.1 11/04/2015 1127   CL 105 11/04/2015 1127   CO2 24 11/04/2015 1127   GLUCOSE 91 11/04/2015 1127   BUN 7 11/04/2015 1127   BUN 11 04/19/2015 1017   CREATININE 0.85 11/04/2015 1127   CALCIUM 9.5 11/04/2015 1127   GFRNONAA >60 11/04/2015 1127   GFRAA >60 11/04/2015 1127     Assessment/Plan: Patient with advanced degeneration at the C5-6 and C6-7 levels, with neck pain and bilateral cervical radiculopathy, who is admitted now for 2 level C5-6 and C6-7 ACDF.  I've discussed with the patient the nature of his condition, the nature the surgical procedure, the typical length of surgery, hospital stay, and overall recuperation. We  discussed limitations postoperatively. I discussed risks of surgery including risks of infection, bleeding, possibly need for transfusion, the risk of nerve root dysfunction with pain, weakness, numbness, or paresthesias, the risk of spinal cord dysfunction with paralysis of all 4 limbs and quadriplegia, and the risk of dural tear and CSF leakage and possible need for further surgery, the risk of esophageal dysfunction causing dysphagia and the risk of laryngeal dysfunction causing hoarseness of the voice, the risk of failure of the arthrodesis and the possible need for further surgery, and the risk of anesthetic complications including myocardial infarction, stroke, pneumonia, and death. We also discussed the need for  postoperative immobilization in a cervical collar. Understanding all this the patient does wish to proceed with surgery and is admitted for such.    Hosie Spangle, MD 09/07/2016 10:05 AM

## 2016-09-07 NOTE — Progress Notes (Signed)
Vitals:   09/07/16 1345 09/07/16 1400 09/07/16 1422 09/07/16 1604  BP: 134/76  (!) 149/84 130/86  Pulse: 62  60 71  Resp: 11  16 16   Temp:  97 F (36.1 C) 97.5 F (36.4 C) 97.6 F (36.4 C)  TempSrc:      SpO2: 100%  97% 100%  Weight:        CBC  Recent Labs  09/07/16 0757  WBC 8.8  HGB 13.2  HCT 40.1  PLT 171    Patient resting in bed, comfortable. Has been up and ambulating with the staff. Incision clean and dry. No erythema, swelling, ecchymosis, or drainage. Patient has voided.  Plan: Encouraged to ambulate in the halls. We'll continue to progress through postoperative recovery.  Hosie Spangle, MD 09/07/2016, 6:32 PM

## 2016-09-07 NOTE — Op Note (Signed)
09/07/2016  12:52 PM  PATIENT:  Tony Richardson  51 y.o. male  PRE-OPERATIVE DIAGNOSIS:  C5-C6 and C6-7 cervical disc herniation, cervical spondylosis, cervical degenerative disease, cervicalgia, cervical radiculopathy  POST-OPERATIVE DIAGNOSIS:  C5-C6 and C6-7 cervical disc herniation, cervical spondylosis, cervical degenerative disease, cervicalgia, cervical radiculopathy  PROCEDURE:  Procedure(s):  C5-6 and C6-7 anterior cervical decompression and arthrodesis with structural allograft and aviator cervical plating  SURGEON:  Surgeon(s): Jovita Gamma, MD Kristeen Miss, MD  ASSISTANTS: Kristeen Miss, M.D.  ANESTHESIA:   general  EBL:  Total I/O In: 1300 [I.V.:1300] Out: -   BLOOD ADMINISTERED:none  COUNT: Correct per nursing staff  DICTATION: Patient was brought to the operating room placed under general endotracheal anesthesia. Patient was placed in 10 pounds of halter traction. The neck was prepped with Betadine soap and solution and draped in a sterile fashion. A horizontal incision was made on the left side of the neck. The line of the incision was infiltrated with local anesthetic with epinephrine. Dissection was carried down thru the subcutaneous tissue and platysma, bipolar cautery was used to maintain hemostasis. Dissection was then carried out thru an avascular plane leaving the sternocleidomastoid carotid artery and jugular vein laterally and the trachea and esophagus medially. The ventral aspect of the vertebral column was identified and a localizing x-ray was taken. The C5-6 and C6-7 levels were identified. The annulus at each level was incised and the disc space entered. Discectomy was performed with micro-curettes and pituitary rongeurs. The operating microscope was draped and brought into the field provided additional magnification illumination and visualization. Discectomy was continued posteriorly thru the disc space and then the cartilaginous endplate was removed using  micro-curettes along with the high-speed drill. Posterior osteophytic overgrowth was removed each level using the high-speed drill along with a 2 mm thin footplated Kerrison punch. Posterior longitudinal ligament along with disc herniation was carefully removed, decompressing the spinal canal and thecal sac. We then continued to remove osteophytic overgrowth and disc material decompressing the neural foramina and exiting nerve roots bilaterally. Once the decompression was completed hemostasis was established at each level with the use of Gelfoam with thrombin. The Gelfoam was removed, a thin layer of Surgifoam was applied, the wound irrigated and hemostasis confirmed. We then measured the height of the intravertebral disc space level and selected a 7 millimeter in height structural allograft for the C5-6 level and a 7 millimeter in height structural allograft for the C6-7 level . Each was hydrated and saline solution and then gently positioned in the intravertebral disc space and countersunk. We then selected a 32 millimeter in height Aviator cervical plate. It was positioned over the fusion construct and secured to the vertebra with a pair of 4 x 14 mm self-tapping variable screws at the C5 level, a pair of 4 x 14 mm self-tapping variable screws at the C6 level, and a pair of 4 x 14 mm self-tapping variable screws at the C7 level. Each screw hole was started with the hand drill and then the screws placed, once all the screws were placed, final tightening was performed, and then the locking system was secured. The wound was irrigated with bacitracin solution, checked for hemostasis which was established and confirmed. An x-ray was taken which showed the grafts in good position, plate and screws in good position, and the overall alignment to be good. We then proceeded with closure. The platysma was closed with interrupted inverted 2-0 undyed Vicryl suture, the subcutaneous and subcuticular closed with interrupted  inverted 3-0 undyed Vicryl suture. The skin edges were approximated with Dermabond. Following surgery the patient was taken out of cervical traction. To be reversed and the anesthetic and taken to the recovery room for further care.  PLAN OF CARE: Admit to inpatient   PATIENT DISPOSITION:  PACU - hemodynamically stable.   Delay start of Pharmacological VTE agent (>24hrs) due to surgical blood loss or risk of bleeding:  yes

## 2016-09-07 NOTE — Anesthesia Postprocedure Evaluation (Signed)
Anesthesia Post Note  Patient: Tony Richardson  Procedure(s) Performed: Procedure(s) (LRB): Anterior Cervical Decompression Fusion - Cervical five-Cervical six - Cervical six-Cervical seven (N/A)  Patient location during evaluation: PACU Anesthesia Type: General Level of consciousness: awake Pain management: pain level controlled Vital Signs Assessment: post-procedure vital signs reviewed and stable Respiratory status: spontaneous breathing Cardiovascular status: stable Anesthetic complications: no       Last Vitals:  Vitals:   09/07/16 1422 09/07/16 1604  BP: (!) 149/84 130/86  Pulse: 60 71  Resp: 16 16  Temp: 36.4 C 36.4 C    Last Pain:  Vitals:   09/07/16 1430  TempSrc:   PainSc: 6                  Orey Moure

## 2016-09-07 NOTE — Anesthesia Procedure Notes (Signed)
Procedure Name: Intubation Date/Time: 09/07/2016 10:21 AM Performed by: Rebekah Chesterfield L Pre-anesthesia Checklist: Patient identified, Emergency Drugs available, Suction available and Patient being monitored Patient Re-evaluated:Patient Re-evaluated prior to inductionOxygen Delivery Method: Circle System Utilized Preoxygenation: Pre-oxygenation with 100% oxygen Intubation Type: IV induction Ventilation: Mask ventilation without difficulty Laryngoscope Size: Mac and 4 Grade View: Grade III Tube type: Oral Tube size: 7.5 mm Number of attempts: 1 Airway Equipment and Method: Stylet Placement Confirmation: ETT inserted through vocal cords under direct vision,  positive ETCO2 and breath sounds checked- equal and bilateral Secured at: 21 cm Tube secured with: Tape Dental Injury: Teeth and Oropharynx as per pre-operative assessment  Difficulty Due To: Difficulty was anticipated, Difficult Airway- due to reduced neck mobility and Difficult Airway- due to anterior larynx Future Recommendations: Recommend- induction with short-acting agent, and alternative techniques readily available

## 2016-09-07 NOTE — Anesthesia Preprocedure Evaluation (Signed)
Anesthesia Evaluation  Patient identified by MRN, date of birth, ID band Patient awake    Reviewed: Allergy & Precautions, NPO status , Patient's Chart, lab work & pertinent test results  History of Anesthesia Complications (+) PONV  Airway Mallampati: II  TM Distance: >3 FB     Dental   Pulmonary pneumonia,    breath sounds clear to auscultation       Cardiovascular negative cardio ROS   Rhythm:Regular Rate:Normal     Neuro/Psych  Headaches,    GI/Hepatic negative GI ROS, Neg liver ROS,   Endo/Other  negative endocrine ROS  Renal/GU Renal disease     Musculoskeletal  (+) Arthritis ,   Abdominal   Peds  Hematology   Anesthesia Other Findings   Reproductive/Obstetrics                             Anesthesia Physical Anesthesia Plan  ASA: III  Anesthesia Plan: General   Post-op Pain Management:    Induction: Intravenous  Airway Management Planned: Oral ETT  Additional Equipment:   Intra-op Plan:   Post-operative Plan: Possible Post-op intubation/ventilation  Informed Consent: I have reviewed the patients History and Physical, chart, labs and discussed the procedure including the risks, benefits and alternatives for the proposed anesthesia with the patient or authorized representative who has indicated his/her understanding and acceptance.   Dental advisory given  Plan Discussed with: CRNA, Anesthesiologist and Surgeon  Anesthesia Plan Comments:         Anesthesia Quick Evaluation

## 2016-09-07 NOTE — Transfer of Care (Signed)
Immediate Anesthesia Transfer of Care Note  Patient: Tony Richardson  Procedure(s) Performed: Procedure(s): Anterior Cervical Decompression Fusion - Cervical five-Cervical six - Cervical six-Cervical seven (N/A)  Patient Location: PACU  Anesthesia Type:General  Level of Consciousness: sedated, patient cooperative and responds to stimulation  Airway & Oxygen Therapy: Patient Spontanous Breathing and Patient connected to nasal cannula oxygen  Post-op Assessment: Report given to RN, Post -op Vital signs reviewed and stable and Patient moving all extremities  Post vital signs: Reviewed and stable  Last Vitals:  Vitals:   09/07/16 0820  BP: 120/78  Pulse: 61  Resp: 12  Temp: 36.7 C    Last Pain:  Vitals:   09/07/16 0820  TempSrc: Oral         Complications: No apparent anesthesia complications

## 2016-09-08 ENCOUNTER — Encounter (HOSPITAL_COMMUNITY): Payer: Self-pay | Admitting: Neurosurgery

## 2016-09-08 MED ORDER — HYDROCODONE-ACETAMINOPHEN 5-325 MG PO TABS: 1.0000 | ORAL_TABLET | ORAL | 0 refills | Status: DC | PRN

## 2016-09-08 NOTE — Progress Notes (Signed)
Pt doing well. Pt and friend given D/C instructions with Rx, verbal understanding was provided. Pt's incision is clean and dry with no sign of infection. Pt's IV was removed prior to D/C. Pt D/C'd home via wheelchair @ [phone] per MD order. Pt is stable @ D/C and has no other needs at this time. Holli Humbles, RN

## 2016-09-08 NOTE — Discharge Instructions (Signed)

## 2016-09-08 NOTE — Discharge Summary (Signed)
Physician Discharge Summary  Patient ID: Tony Richardson MRN: [id number] DOB/AGE: Nov 24, 1965 51 y.o.  Admit date: 09/07/2016 Discharge date: 09/08/2016  Admission Diagnoses:   C5-C6 and C6-7 cervical disc herniation, cervical spondylosis, cervical degenerative disease, cervicalgia, cervical radiculopathy  Discharge Diagnoses:   C5-C6 and C6-7 cervical disc herniation, cervical spondylosis, cervical degenerative disease, cervicalgia, cervical radiculopathy  Active Problems:   HNP (herniated nucleus pulposus), cervical   Discharged Condition: good  Hospital Course: Patient was admitted, underwent a 2 level C5-6 and C6-7 ACDF. Postoperatively he is done well. He is up and ambulating actively. He is voiding well. He is being discharged home with instructions regarding wound care and activities. He is to follow-up with me in the office in 3 weeks.  Discharge Exam:  Blood pressure 112/71, pulse 67, temperature 98.6 F (37 C), resp. rate 18, weight 66.7 kg (147 lb), SpO2 98 %.  Disposition: 01-Home or Self Care  Discharge Instructions    Discharge wound care:    Complete by:  As directed    Leave the wound open to air. Shower daily with the wound uncovered. Water and soapy water should run over the incision area. Do not wash directly on the incision for 2 weeks. Remove the glue after 2 weeks.   Driving Restrictions    Complete by:  As directed    No driving for 2 weeks. May ride in the car locally now. May begin to drive locally in 2 weeks.   Other Restrictions    Complete by:  As directed    Walk gradually increasing distances out in the fresh air at least twice a day. Walking additional 6 times inside the house, gradually increasing distances, daily. No bending, lifting, or twisting. Perform activities between shoulder and waist height (that is at counter height when standing or table height when sitting).     Allergies as of 09/08/2016      Reactions   Other Other (See Comments)   Per Pt  needs to avoid STEROIDs products due to his eyes.  UNSPECIFIED REACTION       Medication List    TAKE these medications   gabapentin 400 MG capsule Commonly known as:  NEURONTIN Take 400 mg by mouth at bedtime.   HYDROcodone-acetaminophen 10-325 MG tablet Commonly known as:  NORCO Take 1 tablet by mouth 2 (two) times daily as needed for moderate pain. What changed:  Another medication with the same name was added. Make sure you understand how and when to take each.   HYDROcodone-acetaminophen 5-325 MG tablet Commonly known as:  NORCO/VICODIN Take 1-2 tablets by mouth every 4 (four) hours as needed for moderate pain. What changed:  You were already taking a medication with the same name, and this prescription was added. Make sure you understand how and when to take each.   hydrOXYzine 50 MG capsule Commonly known as:  VISTARIL Take 1 capsule (50 mg total) by mouth every 4 (four) hours as needed for nausea or vomiting.   sildenafil 100 MG tablet Commonly known as:  VIAGRA Take 100 mg by mouth daily as needed for erectile dysfunction.   tiZANidine 2 MG tablet Commonly known as:  ZANAFLEX Take 1 tablet (2 mg total) by mouth 3 (three) times daily.   traZODone 50 MG tablet Commonly known as:  DESYREL Take 50 mg by mouth at bedtime as needed for sleep.        SignedHosie Spangle 09/08/2016, 8:34 AM

## 2016-09-14 ENCOUNTER — Ambulatory Visit: Payer: Self-pay | Admitting: Adult Health

## 2016-09-29 ENCOUNTER — Ambulatory Visit (HOSPITAL_COMMUNITY)
Admission: RE | Admit: 2016-09-29 | Discharge: 2016-09-29 | Disposition: A | Discharge status: Home / Self Care | Payer: Medicaid Other | Source: Ambulatory Visit | Attending: Urology | Admitting: Urology

## 2016-09-29 ENCOUNTER — Other Ambulatory Visit (HOSPITAL_COMMUNITY): Payer: Self-pay | Admitting: Urology

## 2016-09-29 DIAGNOSIS — D49512 Neoplasm of unspecified behavior of left kidney: Secondary | ICD-10-CM | POA: Diagnosis not present

## 2016-09-29 DIAGNOSIS — D49511 Neoplasm of unspecified behavior of right kidney: Secondary | ICD-10-CM

## 2016-10-01 ENCOUNTER — Other Ambulatory Visit: Payer: Self-pay | Admitting: Urology

## 2016-10-26 ENCOUNTER — Other Ambulatory Visit: Payer: Medicaid Other

## 2016-11-02 ENCOUNTER — Other Ambulatory Visit: Payer: Medicaid Other

## 2016-11-03 ENCOUNTER — Ambulatory Visit (INDEPENDENT_AMBULATORY_CARE_PROVIDER_SITE_OTHER): Payer: Medicaid Other | Admitting: Adult Health

## 2016-11-03 ENCOUNTER — Encounter: Payer: Self-pay | Admitting: Adult Health

## 2016-11-03 VITALS — BP 128/74 | HR 72 | Ht 67.0 in | Wt 148.2 lb

## 2016-11-03 DIAGNOSIS — R519 Headache, unspecified: Secondary | ICD-10-CM

## 2016-11-03 DIAGNOSIS — R51 Headache: Secondary | ICD-10-CM | POA: Diagnosis not present

## 2016-11-03 MED ORDER — TIZANIDINE HCL 2 MG PO TABS: ORAL_TABLET | ORAL | 3 refills | Status: DC

## 2016-11-03 NOTE — Progress Notes (Signed)
PATIENT: EMIN FOREE DOB: [birth year]  REASON FOR VISIT: follow up- headache HISTORY FROM: patient  HISTORY OF PRESENT ILLNESS: Mr. Mckesson is a 51 year old male with a history of headaches and Arnold-Chiari malformation. He was recently placed on tizanidine. He feels that has offered him some benefit with his headaches. He essentially has a daily headache. His headaches are located in the left parietal region. He states his headaches have been ongoing for the last 20 years. He reports that he had neck surgery March 19 with Dr. Sherwood Gambler. He is currently trying to transition off of hydrocodone. He is taking half a tablet twice a day. He states that he is taking gabapentin 400 mg twice a day. He states he would like to get better relief of his headaches. He reports that he is tolerating tizanidine well. Denies any significant drowsiness. He states that he takes trazodone 2- 3 times a week for insomnia. He returns today for an evaluation.  HISTORY 04/29/16: Mr. Hoagland is a 51 year old right-handed white male with a history of Arnold-Chiari malformation, status post suboccipital craniectomy. The patient has continued to have headaches in the back of the head, but he also has pain in the neck and shoulders, occasionally down the arms. The patient is taking hydrocodone on a regular basis, at least one tablet twice daily. He was given Gralise to take as he could not tolerate the gabapentin during the daytime, but his insurance would not cover the medication. The patient takes 400 mg of gabapentin the evening hours. The patient may have severe headaches at times, occasionally associated with some nausea and photophobia and phonophobia. He reports a sharp sensation in the back of the head at times. The patient has had lumbosacral spine surgery at the L4-5 level done by Dr. Joya Salm on 11/13/2015. This did help his low back pain. The patient has been trying to get disability unsuccessfully. HISTORY   REVIEW OF  SYSTEMS: Out of a complete 14 system review of symptoms, the patient complains only of the following symptoms, and all other reviewed systems are negative.  Insomnia, frequent waking, daytime sleepiness, snoring, back pain, neck pain, neck stiffness, frequency of urination, memory loss, headache, numbness, weakness, agitation, confusion, depression, nervous/anxious, light sensitivity, blurred vision, trouble swallowing, drooling  ALLERGIES: Allergies  Allergen Reactions  . Other Other (See Comments)    Per Pt needs to avoid STEROIDs products due to his eyes.  UNSPECIFIED REACTION     HOME MEDICATIONS: Outpatient Medications Prior to Visit  Medication Sig Dispense Refill  . gabapentin (NEURONTIN) 400 MG capsule Take 400 mg by mouth 2 (two) times daily.    Marland Kitchen HYDROcodone-acetaminophen (NORCO/VICODIN) 5-325 MG tablet Take 1-2 tablets by mouth every 4 (four) hours as needed for moderate pain. 70 tablet 0  . hydrOXYzine (VISTARIL) 50 MG capsule Take 1 capsule (50 mg total) by mouth every 4 (four) hours as needed for nausea or vomiting. 30 capsule 0  . sildenafil (VIAGRA) 100 MG tablet Take 100 mg by mouth daily as needed for erectile dysfunction.    Marland Kitchen tiZANidine (ZANAFLEX) 2 MG tablet Take 1 tablet (2 mg total) by mouth 3 (three) times daily. 90 tablet 2  . traZODone (DESYREL) 50 MG tablet Take 50 mg by mouth at bedtime as needed for sleep.    Marland Kitchen HYDROcodone-acetaminophen (NORCO) 10-325 MG per tablet Take 1 tablet by mouth 2 (two) times daily as needed for moderate pain.      No facility-administered medications prior to visit.  PAST MEDICAL HISTORY: Past Medical History:  Diagnosis Date  . Anxiety   . Arnold-Chiari deformity ([place]) 04/19/2015   lots of head pain  . Arthritis   . Chronic back pain    spondylolisthesis  . Depression   . Dizziness   . Facial numbness 04/19/2015  . History of blood transfusion    2 at birth.Issue was b/c of parents blood types per pt  . History of  bronchitis    many yrs ago  . History of colon polyps    benign  . History of shingles   . Nocturia   . Pneumonia    hx of about 5-6 yrs ago  . PONV (postoperative nausea and vomiting)   . Urinary frequency    d/t prostate infection 15 yrs ago   . Weakness    numbness and tingling both hands and feet    PAST SURGICAL HISTORY: Past Surgical History:  Procedure Laterality Date  . ANTERIOR CERVICAL DECOMP/DISCECTOMY FUSION N/A 09/07/2016   Procedure: Anterior Cervical Decompression Fusion - Cervical five-Cervical six - Cervical six-Cervical seven;  Surgeon: Jovita Gamma, MD;  Location: [facility];  Service: Neurosurgery;  Laterality: N/A;  . COLONOSCOPY    . CRANIECTOMY SUBOCCIPITAL W/ CERVICAL LAMINECTOMY / CHIARI    . ESOPHAGOGASTRODUODENOSCOPY    . growth removed from back of right ear     as a child  . SUBOCCIPITAL CRANIECTOMY CERVICAL LAMINECTOMY N/A 04/07/2013   Procedure: SUBOCCIPITAL CRANIECTOMY CERVICAL LAMINECTOMY/DURAPLASTY RESECTION POSTERIOR CERVICAL ONE;  Surgeon: Floyce Stakes, MD;  Location: [facility];  Service: Neurosurgery;  Laterality: N/A;  . THUMB ARTHROSCOPY Right 1991    FAMILY HISTORY: Family History  Problem Relation Age of Onset  . Hypertension Brother   . Colon cancer Maternal Aunt   . Colon cancer Paternal Aunt     SOCIAL HISTORY: Social History   Social History  . Marital status: Married    Spouse name: N/A  . Number of children: 1  . Years of education: 49   Occupational History  . unemployed    Social History Main Topics  . Smoking status: Never Smoker  . Smokeless tobacco: Never Used  . Alcohol use No  . Drug use: No  . Sexual activity: Yes   Other Topics Concern  . Not on file   Social History Narrative   Patient drinks about 3-4 sodas daily.   Patient is right handed.       PHYSICAL EXAM  Vitals:   11/03/16 1043  BP: 128/74  Pulse: 72  Weight: 148 lb 3.2 oz (67.2 kg)  Height: 5\' 7"  (1.702 m)   Body mass  index is 23.21 kg/m.  Generalized: Well developed, in no acute distress   Neurological examination  Mentation: Alert oriented to time, place, history taking. Follows all commands speech and language fluent Cranial nerve II-XII: Pupils were equal round reactive to light. Extraocular movements were full, visual field were full on confrontational test. Facial sensation and strength were normal. Uvula tongue midline. Head turning and shoulder shrug  were normal and symmetric. Motor: The motor testing reveals 5 over 5 strength of all 4 extremities. Good symmetric motor tone is noted throughout.  Sensory: Sensory testing is intact to soft touch on all 4 extremities. No evidence of extinction is noted.  Coordination: Cerebellar testing reveals good finger-nose-finger and heel-to-shin bilaterally.  Gait and station: Gait is normal. Tandem gait is normal. Romberg is negative. No drift is seen.  Reflexes: Deep tendon reflexes are  symmetric and normal bilaterally.   DIAGNOSTIC DATA (LABS, IMAGING, TESTING) - I reviewed patient records, labs, notes, testing and imaging myself where available.  Lab Results  Component Value Date   WBC 8.8 09/07/2016   HGB 13.2 09/07/2016   HCT 40.1 09/07/2016   MCV 87.6 09/07/2016   PLT 171 09/07/2016      Component Value Date/Time   NA 140 11/04/2015 1127   NA 143 04/19/2015 1017   K 4.1 11/04/2015 1127   CL 105 11/04/2015 1127   CO2 24 11/04/2015 1127   GLUCOSE 91 11/04/2015 1127   BUN 7 11/04/2015 1127   BUN 11 04/19/2015 1017   CREATININE 0.85 11/04/2015 1127   CALCIUM 9.5 11/04/2015 1127   PROT 7.2 04/19/2015 1017   ALBUMIN 4.7 04/19/2015 1017   AST 17 04/19/2015 1017   ALT 15 04/19/2015 1017   ALKPHOS 95 04/19/2015 1017   BILITOT 0.4 04/19/2015 1017   GFRNONAA >60 11/04/2015 1127   GFRAA >60 11/04/2015 1127   No results found for: CHOL, HDL, LDLCALC, LDLDIRECT, TRIG, CHOLHDL No results found for: HGBA1C Lab Results  Component Value Date    VITAMINB12 311 04/19/2015   No results found for: TSH    ASSESSMENT AND PLAN 51 y.o. year old male  has a past medical history of Anxiety; Arnold-Chiari deformity ([place]) (04/19/2015); Arthritis; Chronic back pain; Depression; Dizziness; Facial numbness (04/19/2015); History of blood transfusion; History of bronchitis; History of colon polyps; History of shingles; Nocturia; Pneumonia; PONV (postoperative nausea and vomiting); Urinary frequency; and Weakness. here with:  1. Daily headache  The patient continues to have a chronic daily headache. He is currently trying to transition off of hydrocodone. I will increase tizanidine. He will take 2 mg in the morning and at noon and 4 mg at bedtime. He should stop trazodone. He can continue gabapentin 400 mg twice a day. I advised that if his headache frequency does not improve he should let us know. He will follow-up in 6 months with Dr. Jannifer Franklin.  I spent 15 minutes with the patient 50% of this time was spent discussing his medication.   Ward Givens, MSN, NP-C 11/03/2016, 11:34 AM [facility] [address], [place], [place] [address] [phone]

## 2016-11-03 NOTE — Progress Notes (Signed)
I have read the note, and I agree with the clinical assessment and plan.  Delaynee Alred KEITH   

## 2016-11-03 NOTE — Patient Instructions (Signed)
Increase tizanidine to 1 tablet in the morning and noon. 2 tablets at bedtime Stop trazodone Continue Gabapentin If your symptoms worsen or you develop new symptoms please let us know.

## 2016-11-10 NOTE — Patient Instructions (Signed)
Tony Richardson  11/10/2016   Your procedure is scheduled on: 11-19-16  Report to [facility] Main  Entrance Take [facility]  elevators to 3rd floor to  [place] at 1130AM.   Call this number if you have problems the morning of surgery [phone]    Remember: ONLY 1 PERSON MAY GO WITH YOU TO SHORT STAY TO GET  READY MORNING OF YOUR SURGERY.  Do not eat food After Midnight on Tuesday 11-17-16. Drink plenty of clear liquids all day Wednesday  11-18-16 and follow all of [place]'s instructions for bowel prep. Nothing by mouth after midnight on Wednesday!!     Take these medicines the morning of surgery with A SIP OF WATER: buspirone(buspar), gabapentin(neurontin), tizanidine(zanaflex), hydrocodone as needed                                You may not have any metal on your body including hair pins and              piercings  Do not wear jewelry, make-up, lotions, powders or perfumes, deodorant              Men may shave face and neck.   Do not bring valuables to the hospital. [place].  Contacts, dentures or bridgework may not be worn into surgery.  Leave suitcase in the car. After surgery it may be brought to your room.               Please read over the following fact sheets you were given: _____________________________________________________________________   CLEAR LIQUID DIET   Foods Allowed                                                                     Foods Excluded  Coffee and tea, regular and decaf                             liquids that you cannot  Plain Jell-O in any flavor                                             see through such as: Fruit ices (not with fruit pulp)                                     milk, soups, orange juice  Iced Popsicles                                    All solid food Carbonated beverages, regular and diet  Cranberry, grape and apple  juices Sports drinks like Gatorade Lightly seasoned clear broth or consume(fat free) Sugar, honey syrup  Sample Menu Breakfast                                Lunch                                     Supper Cranberry juice                    Beef broth                            Chicken broth Jell-O                                     Grape juice                           Apple juice Coffee or tea                        Jell-O                                      Popsicle                                                Coffee or tea                        Coffee or tea  _____________________________________________________________________  [facility] - Preparing for Surgery Before surgery, you can play an important role.  Because skin is not sterile, your skin needs to be as free of germs as possible.  You can reduce the number of germs on your skin by washing with CHG (chlorahexidine gluconate) soap before surgery.  CHG is an antiseptic cleaner which kills germs and bonds with the skin to continue killing germs even after washing. Please DO NOT use if you have an allergy to CHG or antibacterial soaps.  If your skin becomes reddened/irritated stop using the CHG and inform your nurse when you arrive at Short Stay. Do not shave (including legs and underarms) for at least 48 hours prior to the first CHG shower.  You may shave your face/neck. Please follow these instructions carefully:  1.  Shower with CHG Soap the night before surgery and the  morning of Surgery.  2.  If you choose to wash your hair, wash your hair first as usual with your  normal  shampoo.  3.  After you shampoo, rinse your hair and body thoroughly to remove the  shampoo.                           4.  Use CHG as you would any other liquid soap.  You can apply chg directly  to the skin and wash  Gently with a scrungie or clean washcloth.  5.  Apply the CHG Soap to your body ONLY FROM THE NECK DOWN.   Do not  use on face/ open                           Wound or open sores. Avoid contact with eyes, ears mouth and genitals (private parts).                       Wash face,  Genitals (private parts) with your normal soap.             6.  Wash thoroughly, paying special attention to the area where your surgery  will be performed.  7.  Thoroughly rinse your body with warm water from the neck down.  8.  DO NOT shower/wash with your normal soap after using and rinsing off  the CHG Soap.                9.  Pat yourself dry with a clean towel.            10.  Wear clean pajamas.            11.  Place clean sheets on your bed the night of your first shower and do not  sleep with pets. Day of Surgery : Do not apply any lotions/deodorants the morning of surgery.  Please wear clean clothes to the hospital/surgery center.  FAILURE TO FOLLOW THESE INSTRUCTIONS MAY RESULT IN THE CANCELLATION OF YOUR SURGERY PATIENT SIGNATURE_________________________________  NURSE SIGNATURE__________________________________  ________________________________________________________________________   Tony Richardson  An incentive spirometer is a tool that can help keep your lungs clear and active. This tool measures how well you are filling your lungs with each breath. Taking long deep breaths may help reverse or decrease the chance of developing breathing (pulmonary) problems (especially infection) following:  A long period of time when you are unable to move or be active. BEFORE THE PROCEDURE   If the spirometer includes an indicator to show your best effort, your nurse or respiratory therapist will set it to a desired goal.  If possible, sit up straight or lean slightly forward. Try not to slouch.  Hold the incentive spirometer in an upright position. INSTRUCTIONS FOR USE  1. Sit on the edge of your bed if possible, or sit up as far as you can in bed or on a chair. 2. Hold the incentive spirometer in an upright  position. 3. Breathe out normally. 4. Place the mouthpiece in your mouth and seal your lips tightly around it. 5. Breathe in slowly and as deeply as possible, raising the piston or the ball toward the top of the column. 6. Hold your breath for 3-5 seconds or for as long as possible. Allow the piston or ball to fall to the bottom of the column. 7. Remove the mouthpiece from your mouth and breathe out normally. 8. Rest for a few seconds and repeat Steps 1 through 7 at least 10 times every 1-2 hours when you are awake. Take your time and take a few normal breaths between deep breaths. 9. The spirometer may include an indicator to show your best effort. Use the indicator as a goal to work toward during each repetition. 10. After each set of 10 deep breaths, practice coughing to be sure your lungs are clear. If you have an incision (the cut made at the time of  surgery), support your incision when coughing by placing a pillow or rolled up towels firmly against it. Once you are able to get out of bed, walk around indoors and cough well. You may stop using the incentive spirometer when instructed by your caregiver.  RISKS AND COMPLICATIONS  Take your time so you do not get dizzy or light-headed.  If you are in pain, you may need to take or ask for pain medication before doing incentive spirometry. It is harder to take a deep breath if you are having pain. AFTER USE  Rest and breathe slowly and easily.  It can be helpful to keep track of a log of your progress. Your caregiver can provide you with a simple table to help with this. If you are using the spirometer at home, follow these instructions: [place] IF:   You are having difficultly using the spirometer.  You have trouble using the spirometer as often as instructed.  Your pain medication is not giving enough relief while using the spirometer.  You develop fever of 100.5 F (38.1 C) or higher. SEEK IMMEDIATE MEDICAL CARE IF:    You cough up bloody sputum that had not been present before.  You develop fever of 102 F (38.9 C) or greater.  You develop worsening pain at or near the incision site. MAKE SURE YOU:   Understand these instructions.  Will watch your condition.  Will get help right away if you are not doing well or get worse. Document Released: 10/19/2006 Document Revised: 08/31/2011 Document Reviewed: 12/20/2006 Exit[employer] Patient Information 2014 ExitCare, [place].   ________________________________________________________________________  WHAT IS A BLOOD TRANSFUSION? Blood Transfusion Information  A transfusion is the replacement of blood or some of its parts. Blood is made up of multiple cells which provide different functions.  Red blood cells carry oxygen and are used for blood loss replacement.  White blood cells fight against infection.  Platelets control bleeding.  Plasma helps clot blood.  Other blood products are available for specialized needs, such as hemophilia or other clotting disorders. BEFORE THE TRANSFUSION  Who gives blood for transfusions?   Healthy volunteers who are fully evaluated to make sure their blood is safe. This is blood bank blood. Transfusion therapy is the safest it has ever been in the practice of medicine. Before blood is taken from a donor, a complete history is taken to make sure that person has no history of diseases nor engages in risky social behavior (examples are intravenous drug use or sexual activity with multiple partners). The donor's travel history is screened to minimize risk of transmitting infections, such as malaria. The donated blood is tested for signs of infectious diseases, such as HIV and hepatitis. The blood is then tested to be sure it is compatible with you in order to minimize the chance of a transfusion reaction. If you or a relative donates blood, this is often done in anticipation of surgery and is not appropriate for emergency  situations. It takes many days to process the donated blood. RISKS AND COMPLICATIONS Although transfusion therapy is very safe and saves many lives, the main dangers of transfusion include:   Getting an infectious disease.  Developing a transfusion reaction. This is an allergic reaction to something in the blood you were given. Every precaution is taken to prevent this. The decision to have a blood transfusion has been considered carefully by your caregiver before blood is given. Blood is not given unless the benefits outweigh the risks. AFTER THE  TRANSFUSION  Right after receiving a blood transfusion, you will usually feel much better and more energetic. This is especially true if your red blood cells have gotten low (anemic). The transfusion raises the level of the red blood cells which carry oxygen, and this usually causes an energy increase.  The nurse administering the transfusion will monitor you carefully for complications. HOME CARE INSTRUCTIONS  No special instructions are needed after a transfusion. You may find your energy is better. Speak with your caregiver about any limitations on activity for underlying diseases you may have. SEEK MEDICAL CARE IF:   Your condition is not improving after your transfusion.  You develop redness or irritation at the intravenous (IV) site. SEEK IMMEDIATE MEDICAL CARE IF:  Any of the following symptoms occur over the next 12 hours:  Shaking chills.  You have a temperature by mouth above 102 F (38.9 C), not controlled by medicine.  Chest, back, or muscle pain.  People around you feel you are not acting correctly or are confused.  Shortness of breath or difficulty breathing.  Dizziness and fainting.  You get a rash or develop hives.  You have a decrease in urine output.  Your urine turns a dark color or changes to pink, red, or brown. Any of the following symptoms occur over the next 10 days:  You have a temperature by mouth above  102 F (38.9 C), not controlled by medicine.  Shortness of breath.  Weakness after normal activity.  The white part of the eye turns yellow (jaundice).  You have a decrease in the amount of urine or are urinating less often.  Your urine turns a dark color or changes to pink, red, or brown. Document Released: 06/05/2000 Document Revised: 08/31/2011 Document Reviewed: 01/23/2008 [facility] Patient Information 2014 [place], [place].  _______________________________________________________________________

## 2016-11-10 NOTE — Progress Notes (Signed)
[facility] neurology 11-03-16 epic CXR 09-29-16 epic

## 2016-11-12 ENCOUNTER — Encounter (HOSPITAL_COMMUNITY): Payer: Self-pay

## 2016-11-12 ENCOUNTER — Encounter (HOSPITAL_COMMUNITY)
Admission: RE | Admit: 2016-11-12 | Discharge: 2016-11-12 | Disposition: A | Discharge status: Home / Self Care | Payer: Medicaid Other | Source: Ambulatory Visit | Attending: Urology | Admitting: Urology

## 2016-11-12 DIAGNOSIS — Z01818 Encounter for other preprocedural examination: Secondary | ICD-10-CM | POA: Insufficient documentation

## 2016-11-12 DIAGNOSIS — D49511 Neoplasm of unspecified behavior of right kidney: Secondary | ICD-10-CM | POA: Insufficient documentation

## 2016-11-12 LAB — BASIC METABOLIC PANEL
ANION GAP: 6 (ref 5–15)
BUN: 12 mg/dL (ref 6–20)
CHLORIDE: 108 mmol/L (ref 101–111)
CO2: 28 mmol/L (ref 22–32)
Calcium: 9.1 mg/dL (ref 8.9–10.3)
Creatinine, Ser: 1.1 mg/dL (ref 0.61–1.24)
GFR calc Af Amer: >60 mL/min (ref >60)
GLUCOSE: 79 mg/dL (ref 65–99)
POTASSIUM: 4.2 mmol/L (ref 3.5–5.1)
Sodium: 142 mmol/L (ref 135–145)

## 2016-11-12 LAB — CBC
HEMATOCRIT: 38.5 % — AB (ref 39.0–52.0)
Hemoglobin: 12.6 g/dL — ABNORMAL LOW (ref 13.0–17.0)
MCH: 28.7 pg (ref 26.0–34.0)
MCHC: 32.7 g/dL (ref 30.0–36.0)
MCV: 87.7 fL (ref 78.0–100.0)
Platelets: 167 10*3/uL (ref 150–400)
RBC: 4.39 MIL/uL (ref 4.22–5.81)
RDW: 14.4 % (ref 11.5–15.5)
WBC: 7.5 10*3/uL (ref 4.0–10.5)

## 2016-11-12 LAB — ABO/RH: ABO/RH(D): A POS

## 2016-11-18 NOTE — H&P (Signed)
Office Visit Report     11/06/2016   --------------------------------------------------------------------------------   Tony Richardson  MRN: [id number]  PRIMARY CARE:  Myriam Jacobson, MD  DOB: [birth year], 51 year old Male  REFERRING:    SSN: [id number]  PROVIDER:  Kathie Rhodes, M.D.    TREATING:  Jiles Crocker    LOCATION:  [facility] [phone]   --------------------------------------------------------------------------------   CC/HPI: Bilateral renal neoplasms   He is seen today for pre-op physical for hx of bilateral renal neoplasms. He is scheduled for surgery on 11/19/16 for Rt robotic assisted lap partial nephrectomy. He is a 51 year old gentleman who was initially diagnosed incidentally with bilateral renal masses after undergoing an MRI of his spine around the time he had lumbar spine surgery in 2016. He subsequently underwent further evaluation of his right renal lesion including a CT guided biopsy that was indeterminate. He is undergoing follow-up surveillance imaging which has not demonstrated any progression or increase in size of these lesions. He did undergo cervical spine surgery approximately 3 weeks ago and is currently recovering well from this.   With regard to his most recent MRI that was performed on 08/14/16, he does have multiple benign-appearing renal cysts bilaterally. He does have a 1.5 cm heterogeneous and likely enhancing lesion of the posterior interpolar region of the right kidney that is mostly endophytic raising concern for malignancy. He also has a separate less defined lesion on the lateral upper pole aspect of the right kidney measuring 1.9 cm that is indeterminate. On the left kidney, there is a lesion measuring 1.3 cm it does demonstrate apparent enhancement. This is a cystic lesion and is quite central in the renal hilum of the left kidney and is not easily accessible likely for a biopsy or nephron sparing surgery.   May 2018: He is scheduled to  undergo right partial nephrectomy at the end of this month. Here today for pre-Op h&p. Clinically he is not having significant bothersome LUTS. Denies any specific unilateral flank, back, or lower abdominal pain/discomfort. Denies gross hematuria. Denies recent fevers or infections.     ALLERGIES: No Allergies Steroids - *Pt reports he is supposed to avoid taking anything that has steroids in it*    MEDICATIONS: Gabapentin 400 mg capsule 1 capsule PO QD TAKE ONE CAPSULE BY MOUTH THREE TIMES DAILY AND THREE AT BEDTIME  Buspirone Hcl  Hydrocodone-Acetaminophen 10 mg-325 mg/15 ml (15 ml) solution, oral Oral  Tizanidine Hcl 2 mg tablet     GU PSH: Prostate Needle Biopsy - 2009      [place] Notes: Hand Surgery, Cranial Decompression Subtemporal, Biopsy Of The Prostate Needle, back surgery 2017- L4 and L5 fused   NON-GU PSH: Back Surgery (Unspecified) Neck Surgery (Unspecified) Subtemporal Decompression - 2015    GU PMH: Left renal neoplasm (Stable), We then discussed the fact that the lesion in his left kidney, although still small, is a little bit more worrisome than the one on the right. - 08/18/2016 Renal cyst, Bilateral, He has bilateral simple cysts that I told him would not be causing the pain in his left flank that he has described. - 08/18/2016, (Stable), He has multiple right renal cysts that are again noted today., - 02/18/2016, Renal cyst, left, - 02/26/2015 Right renal neoplasm (Stable), He does have a stable lesion in the right kidney. This has been biopsied previously. - 08/18/2016, (Stable), The right kidney appears stable with multiple cysts but I do not see any definite solid masses  or worrisome mural nodularity. What I recommended is reimaging with an MRI scan again in 6 months., - 02/18/2016 Other Disorder Kidney/ureter, Renal mass, right - 08/19/2015 Chronic prostatitis, Prostatitis, chronic - 36 Male ED, unspecified, Erectile dysfunction - 2015 Prostate Disorder, Unspec, Prostate  disorder - 2014      PMH Notes: He has a history of chronic prostatitis/prostatodynia and was having a lot of problems with that for a long time but it seems to have calmed down quite significantly. He is not required any antibiotic therapy in over 4 yrs and currently is free of any symptoms on Neurontin. He does have some mild organic erectile dysfunction which is been managed with 50 year old of Viagra and has no new complaints today. He has noted that if he misses a couple of doses of his Neurontin he will note slowing of his urinary stream.   Erectile dysfunction: This has responded to Viagra 50 mg.   Bilateral renal cysts: He was noted to have multiple renal cysts bilaterally on CT scan in 9/16.   Renal lesions: He was found by CT scan in 9/16 to have a 7 mm nodule emanating from the anterior aspect of the left kidney as well as an exophytic, partially enhancing lesion emanating from the lower pole of the right kidney.  MRI 2/17 - bilateral Bosniak 1 and 2 cysts, nonenhancing right lateral irregular fluid collection and enhancing right posterior, lower pole renal lesion (16 x 16 mm).  CT-guided biopsy of right renal lesion 03/15/15 - no evidence of malignancy.  Management: Continued close surveillance.     NON-GU PMH: Encounter for general adult medical examination without abnormal findings, Encounter for preventive health examination - 03/26/2015 Insomnia, unspecified, Insomnia - 2015 Anxiety, Anxiety - 2014 Personal history of other diseases of the nervous system and sense organs, History of sleep apnea - 2014 Personal history of other mental and behavioral disorders, History of depression - 2014    FAMILY HISTORY: Family Health Status Number - Runs In Family No pertinent family history - Other   SOCIAL HISTORY: Marital Status: Married Current Smoking Status: Patient has never smoked.  Drinks 1 drink per month. Social Drinker.  Drinks 4+ caffeinated drinks per day.    REVIEW OF SYSTEMS:     GU Review Male:   Patient reports get up at night to urinate. Patient denies frequent urination, hard to postpone urination, burning/ pain with urination, leakage of urine, stream starts and stops, trouble starting your stream, have to strain to urinate , erection problems, and penile pain.  Gastrointestinal (Upper):   Patient denies nausea, vomiting, and indigestion/ heartburn.  Gastrointestinal (Lower):   Patient denies diarrhea and constipation.  Constitutional:   Patient reports fatigue. Patient denies fever, night sweats, and weight loss.  Skin:   Patient denies skin rash/ lesion and itching.  Eyes:   Patient reports blurred vision. Patient denies double vision.  Ears/ Nose/ Throat:   Patient denies sore throat and sinus problems.  Hematologic/Lymphatic:   Patient denies easy bruising and swollen glands.  Cardiovascular:   Patient denies leg swelling and chest pains.  Respiratory:   Patient denies cough and shortness of breath.  Endocrine:   Patient denies excessive thirst.  Musculoskeletal:   Patient reports back pain. Patient denies joint pain.  Neurological:   Patient reports headaches. Patient denies dizziness.  Psychologic:   Patient reports depression and anxiety.    VITAL SIGNS:      11/06/2016 01:16 PM  BP 128/80 mmHg  Pulse  67 /min  Temperature 97.2 F / 36 C   MULTI-SYSTEM PHYSICAL EXAMINATION:    Constitutional: Well-nourished. No physical deformities. Normally developed. Good grooming.  Respiratory: No labored breathing, no use of accessory muscles. CTA.  Cardiovascular: Normal temperature, normal extremity pulses, no swelling, no varicosities. RRR w/o murmur  Skin: No paleness, no jaundice, no cyanosis. No lesion, no ulcer, no rash.  Neurologic / Psychiatric: Oriented to time, oriented to place, oriented to person. No depression, no anxiety, no agitation.  Gastrointestinal: No mass, no tenderness, no rigidity, non obese abdomen. No flank or suprapubic tenderness.   Musculoskeletal: Spine, ribs, pelvis no bilateral tenderness. Normal gait and station of head and neck.     PAST DATA REVIEWED:  Source Of History:  Patient  Records Review:   Previous Patient Records  Urine Test Review:   Urinalysis   11/06/16  Urinalysis  Urine Appearance Clear   Urine Color Yellow   Urine Glucose Neg   Urine Bilirubin Neg   Urine Ketones Trace   Urine Specific Gravity 1.030   Urine Blood Trace   Urine pH 6.0   Urine Protein Neg   Urine Urobilinogen 1.0   Urine Nitrites Neg   Urine Leukocyte Esterase Neg   Urine WBC/hpf 0 - 5/hpf   Urine RBC/hpf 0 - 2/hpf   Urine Epithelial Cells 0 - 5/hpf   Urine Bacteria NS (Not Seen)   Urine Mucous Present   Urine Yeast NS (Not Seen)   Urine Trichomonas Not Present   Urine Cystals NS (Not Seen)   Urine Casts NS (Not Seen)   Urine Sperm Not Present    PROCEDURES:          Urinalysis w/Scope Dipstick Dipstick Cont'd Micro  Color: Yellow Bilirubin: Neg WBC/hpf: 0 - 5/hpf  Appearance: Clear Ketones: Trace RBC/hpf: 0 - 2/hpf  Specific Gravity: 1.030 Blood: Trace Bacteria: NS (Not Seen)  pH: 6.0 Protein: Neg Cystals: NS (Not Seen)  Glucose: Neg Urobilinogen: 1.0 Casts: NS (Not Seen)    Nitrites: Neg Trichomonas: Not Present    Leukocyte Esterase: Neg Mucous: Present      Epithelial Cells: 0 - 5/hpf      Yeast: NS (Not Seen)      Sperm: Not Present    ASSESSMENT:      ICD-10 Details  1 GU:   Left renal neoplasm - D49.512   2   Right renal neoplasm - D49.511    PLAN:           Orders Labs Urine Culture          Schedule Return Visit/Planned Activity: Keep Scheduled Appointment - Schedule Surgery          Document Letter(s):  Created for Patient: Clinical Summary         Notes:   Physical exam within normal limits. Urinalysis will be sent for culture to serve as baseline prior to his procedure. All questions answered to the best of my ability. Clinically patient doing well today. He will proceed with  scheduled procedure at the end of this month.    * Signed by Jiles Crocker on 11/06/16 at 1:29 PM (EDT)*

## 2016-11-19 ENCOUNTER — Inpatient Hospital Stay (HOSPITAL_COMMUNITY): Payer: Medicaid Other | Admitting: Anesthesiology

## 2016-11-19 ENCOUNTER — Inpatient Hospital Stay (HOSPITAL_COMMUNITY)
Admission: RE | Admit: 2016-11-19 | Discharge: 2016-11-21 | DRG: 658 | Disposition: A | Discharge status: Home / Self Care | Payer: Medicaid Other | Source: Ambulatory Visit | Attending: Urology | Admitting: Urology

## 2016-11-19 ENCOUNTER — Encounter (HOSPITAL_COMMUNITY)
Admission: RE | Disposition: A | Discharge status: Home / Self Care | Payer: Self-pay | Source: Ambulatory Visit | Attending: Urology

## 2016-11-19 ENCOUNTER — Encounter (HOSPITAL_COMMUNITY): Payer: Self-pay | Admitting: *Deleted

## 2016-11-19 DIAGNOSIS — N281 Cyst of kidney, acquired: Secondary | ICD-10-CM | POA: Diagnosis present

## 2016-11-19 DIAGNOSIS — N528 Other male erectile dysfunction: Secondary | ICD-10-CM | POA: Diagnosis present

## 2016-11-19 DIAGNOSIS — G4700 Insomnia, unspecified: Secondary | ICD-10-CM | POA: Diagnosis present

## 2016-11-19 DIAGNOSIS — N411 Chronic prostatitis: Secondary | ICD-10-CM | POA: Diagnosis present

## 2016-11-19 DIAGNOSIS — D49512 Neoplasm of unspecified behavior of left kidney: Secondary | ICD-10-CM | POA: Diagnosis present

## 2016-11-19 DIAGNOSIS — F329 Major depressive disorder, single episode, unspecified: Secondary | ICD-10-CM | POA: Diagnosis present

## 2016-11-19 DIAGNOSIS — N4281 Prostatodynia syndrome: Secondary | ICD-10-CM | POA: Diagnosis present

## 2016-11-19 DIAGNOSIS — Q0700 Arnold-Chiari syndrome without spina bifida or hydrocephalus: Secondary | ICD-10-CM

## 2016-11-19 DIAGNOSIS — M1990 Unspecified osteoarthritis, unspecified site: Secondary | ICD-10-CM | POA: Diagnosis present

## 2016-11-19 DIAGNOSIS — Z79891 Long term (current) use of opiate analgesic: Secondary | ICD-10-CM | POA: Diagnosis not present

## 2016-11-19 DIAGNOSIS — Z888 Allergy status to other drugs, medicaments and biological substances status: Secondary | ICD-10-CM | POA: Diagnosis not present

## 2016-11-19 DIAGNOSIS — C641 Malignant neoplasm of right kidney, except renal pelvis: Secondary | ICD-10-CM | POA: Diagnosis present

## 2016-11-19 DIAGNOSIS — G4730 Sleep apnea, unspecified: Secondary | ICD-10-CM | POA: Diagnosis present

## 2016-11-19 DIAGNOSIS — Z79899 Other long term (current) drug therapy: Secondary | ICD-10-CM

## 2016-11-19 DIAGNOSIS — F419 Anxiety disorder, unspecified: Secondary | ICD-10-CM | POA: Diagnosis present

## 2016-11-19 DIAGNOSIS — D49511 Neoplasm of unspecified behavior of right kidney: Secondary | ICD-10-CM | POA: Diagnosis present

## 2016-11-19 DIAGNOSIS — Z981 Arthrodesis status: Secondary | ICD-10-CM | POA: Diagnosis not present

## 2016-11-19 HISTORY — PX: ROBOTIC ASSITED PARTIAL NEPHRECTOMY: SHX6087

## 2016-11-19 HISTORY — PX: OPERATIVE ULTRASOUND: SHX5996

## 2016-11-19 LAB — TYPE AND SCREEN
ABO/RH(D): A POS
ANTIBODY SCREEN: NEGATIVE

## 2016-11-19 LAB — BASIC METABOLIC PANEL
Anion gap: 11 (ref 5–15)
BUN: 10 mg/dL (ref 6–20)
CALCIUM: 8.6 mg/dL — AB (ref 8.9–10.3)
CHLORIDE: 104 mmol/L (ref 101–111)
CO2: 25 mmol/L (ref 22–32)
CREATININE: 1.07 mg/dL (ref 0.61–1.24)
Glucose, Bld: 138 mg/dL — ABNORMAL HIGH (ref 65–99)
Potassium: 3.7 mmol/L (ref 3.5–5.1)
SODIUM: 140 mmol/L (ref 135–145)

## 2016-11-19 LAB — HEMOGLOBIN AND HEMATOCRIT, BLOOD
HEMATOCRIT: 40.8 % (ref 39.0–52.0)
HEMOGLOBIN: 13.4 g/dL (ref 13.0–17.0)

## 2016-11-19 SURGERY — NEPHRECTOMY, PARTIAL, ROBOT-ASSISTED
Anesthesia: General | Laterality: Right

## 2016-11-19 MED ORDER — ROCURONIUM BROMIDE 100 MG/10ML IV SOLN
INTRAVENOUS | Status: DC | PRN
  Administered 2016-11-19 (×3): 10 mg via INTRAVENOUS
  Administered 2016-11-19: 50 mg via INTRAVENOUS

## 2016-11-19 MED ORDER — DIPHENHYDRAMINE HCL 12.5 MG/5ML PO ELIX: 12.5000 mg | ORAL_SOLUTION | Freq: Four times a day (QID) | ORAL | Status: DC | PRN

## 2016-11-19 MED ORDER — METOCLOPRAMIDE HCL 5 MG/ML IJ SOLN
INTRAMUSCULAR | Status: DC | PRN
  Administered 2016-11-19: 10 mg via INTRAVENOUS

## 2016-11-19 MED ORDER — TIZANIDINE HCL 4 MG PO TABS
2.0000 mg | ORAL_TABLET | Freq: Two times a day (BID) | ORAL | Status: DC
  Administered 2016-11-20 – 2016-11-21 (×3): 2 mg via ORAL
  Filled 2016-11-19 (×3): qty 1

## 2016-11-19 MED ORDER — MANNITOL 25 % IV SOLN
25.0000 g | Freq: Once | INTRAVENOUS | Status: DC
  Filled 2016-11-19: qty 100

## 2016-11-19 MED ORDER — CEFAZOLIN SODIUM-DEXTROSE 2-4 GM/100ML-% IV SOLN
2.0000 g | INTRAVENOUS | Status: AC
  Administered 2016-11-19: 2 g via INTRAVENOUS
  Filled 2016-11-19: qty 100

## 2016-11-19 MED ORDER — SUGAMMADEX SODIUM 200 MG/2ML IV SOLN
INTRAVENOUS | Status: AC
  Filled 2016-11-19: qty 2

## 2016-11-19 MED ORDER — HYDROCODONE-ACETAMINOPHEN 5-325 MG PO TABS: 1.0000 | ORAL_TABLET | ORAL | 0 refills | Status: DC | PRN

## 2016-11-19 MED ORDER — CEFAZOLIN SODIUM-DEXTROSE 1-4 GM/50ML-% IV SOLN
1.0000 g | Freq: Three times a day (TID) | INTRAVENOUS | Status: AC
  Administered 2016-11-19 – 2016-11-20 (×2): 1 g via INTRAVENOUS
  Filled 2016-11-19 (×2): qty 50

## 2016-11-19 MED ORDER — CEFAZOLIN SODIUM-DEXTROSE 2-3 GM-% IV SOLR: INTRAVENOUS | Status: DC | PRN

## 2016-11-19 MED ORDER — ONDANSETRON HCL 4 MG/2ML IJ SOLN
INTRAMUSCULAR | Status: AC
  Filled 2016-11-19: qty 2

## 2016-11-19 MED ORDER — LACTATED RINGERS IV SOLN
INTRAVENOUS | Status: DC
  Administered 2016-11-19: 1000 mL via INTRAVENOUS

## 2016-11-19 MED ORDER — HYDROMORPHONE HCL 1 MG/ML IJ SOLN: 0.2500 mg | INTRAMUSCULAR | Status: DC | PRN

## 2016-11-19 MED ORDER — GABAPENTIN 400 MG PO CAPS
400.0000 mg | ORAL_CAPSULE | Freq: Two times a day (BID) | ORAL | Status: DC
  Administered 2016-11-19 – 2016-11-21 (×4): 400 mg via ORAL
  Filled 2016-11-19 (×4): qty 1

## 2016-11-19 MED ORDER — PROMETHAZINE HCL 25 MG/ML IJ SOLN: 6.2500 mg | INTRAMUSCULAR | Status: DC | PRN

## 2016-11-19 MED ORDER — TIZANIDINE HCL 4 MG PO TABS
4.0000 mg | ORAL_TABLET | Freq: Every day | ORAL | Status: DC
  Administered 2016-11-19 – 2016-11-20 (×2): 4 mg via ORAL
  Filled 2016-11-19 (×2): qty 1

## 2016-11-19 MED ORDER — FENTANYL CITRATE (PF) 250 MCG/5ML IJ SOLN
INTRAMUSCULAR | Status: AC
  Filled 2016-11-19: qty 5

## 2016-11-19 MED ORDER — SODIUM CHLORIDE 0.9 % IJ SOLN
INTRAMUSCULAR | Status: AC
  Filled 2016-11-19: qty 20

## 2016-11-19 MED ORDER — HYDROMORPHONE HCL 2 MG/ML IJ SOLN
INTRAMUSCULAR | Status: AC
  Filled 2016-11-19: qty 1

## 2016-11-19 MED ORDER — LACTATED RINGERS IR SOLN
Status: DC | PRN
  Administered 2016-11-19: 1000 mL

## 2016-11-19 MED ORDER — DEXTROSE-NACL 5-0.45 % IV SOLN
INTRAVENOUS | Status: DC
  Administered 2016-11-19 – 2016-11-21 (×3): via INTRAVENOUS

## 2016-11-19 MED ORDER — METOCLOPRAMIDE HCL 5 MG/ML IJ SOLN
INTRAMUSCULAR | Status: AC
  Filled 2016-11-19: qty 2

## 2016-11-19 MED ORDER — ONDANSETRON HCL 4 MG/2ML IJ SOLN
4.0000 mg | INTRAMUSCULAR | Status: DC | PRN
  Administered 2016-11-19 – 2016-11-20 (×4): 4 mg via INTRAVENOUS
  Filled 2016-11-19 (×3): qty 2

## 2016-11-19 MED ORDER — LACTATED RINGERS IV SOLN: INTRAVENOUS | Status: DC

## 2016-11-19 MED ORDER — FENTANYL CITRATE (PF) 100 MCG/2ML IJ SOLN
INTRAMUSCULAR | Status: DC | PRN
  Administered 2016-11-19 (×5): 50 ug via INTRAVENOUS

## 2016-11-19 MED ORDER — BUSPIRONE HCL 5 MG PO TABS: 10.0000 mg | ORAL_TABLET | Freq: Every day | ORAL | Status: DC | PRN

## 2016-11-19 MED ORDER — SCOPOLAMINE 1 MG/3DAYS TD PT72
MEDICATED_PATCH | TRANSDERMAL | Status: AC
  Filled 2016-11-19: qty 1

## 2016-11-19 MED ORDER — SODIUM CHLORIDE 0.9 % IJ SOLN
INTRAMUSCULAR | Status: AC
  Filled 2016-11-19: qty 10

## 2016-11-19 MED ORDER — LACTATED RINGERS IV SOLN
INTRAVENOUS | Status: DC
  Administered 2016-11-19 (×3): via INTRAVENOUS

## 2016-11-19 MED ORDER — ONDANSETRON HCL 4 MG/2ML IJ SOLN
INTRAMUSCULAR | Status: DC | PRN
  Administered 2016-11-19: 4 mg via INTRAVENOUS

## 2016-11-19 MED ORDER — SCOPOLAMINE 1 MG/3DAYS TD PT72
MEDICATED_PATCH | TRANSDERMAL | Status: DC | PRN
  Administered 2016-11-19: 1 via TRANSDERMAL

## 2016-11-19 MED ORDER — LIDOCAINE 2% (20 MG/ML) 5 ML SYRINGE
INTRAMUSCULAR | Status: AC
  Filled 2016-11-19: qty 5

## 2016-11-19 MED ORDER — BUPIVACAINE LIPOSOME 1.3 % IJ SUSP
20.0000 mL | Freq: Once | INTRAMUSCULAR | Status: AC
  Administered 2016-11-19: 20 mL
  Filled 2016-11-19: qty 20

## 2016-11-19 MED ORDER — ROCURONIUM BROMIDE 50 MG/5ML IV SOSY
PREFILLED_SYRINGE | INTRAVENOUS | Status: AC
Start: 2016-11-19 — End: 2016-11-19
  Filled 2016-11-19: qty 5

## 2016-11-19 MED ORDER — MORPHINE SULFATE (PF) 4 MG/ML IV SOLN
2.0000 mg | INTRAVENOUS | Status: DC | PRN
  Administered 2016-11-19 (×3): 4 mg via INTRAVENOUS
  Filled 2016-11-19 (×3): qty 1

## 2016-11-19 MED ORDER — MIDAZOLAM HCL 2 MG/2ML IJ SOLN
INTRAMUSCULAR | Status: AC
  Filled 2016-11-19: qty 2

## 2016-11-19 MED ORDER — SODIUM CHLORIDE 0.9 % IJ SOLN
INTRAMUSCULAR | Status: DC | PRN
  Administered 2016-11-19: 20 mL

## 2016-11-19 MED ORDER — TIZANIDINE HCL 4 MG PO TABS: 2.0000 mg | ORAL_TABLET | Freq: Three times a day (TID) | ORAL | Status: DC

## 2016-11-19 MED ORDER — SUGAMMADEX SODIUM 200 MG/2ML IV SOLN
INTRAVENOUS | Status: DC | PRN
  Administered 2016-11-19: 200 mg via INTRAVENOUS

## 2016-11-19 MED ORDER — LIDOCAINE HCL (CARDIAC) 20 MG/ML IV SOLN
INTRAVENOUS | Status: DC | PRN
  Administered 2016-11-19: 80 mg via INTRAVENOUS

## 2016-11-19 MED ORDER — HYDROMORPHONE HCL 1 MG/ML IJ SOLN
INTRAMUSCULAR | Status: DC | PRN
  Administered 2016-11-19 (×3): .4 mg via INTRAVENOUS

## 2016-11-19 MED ORDER — STERILE WATER FOR IRRIGATION IR SOLN
Status: DC | PRN
  Administered 2016-11-19: 1000 mL

## 2016-11-19 MED ORDER — DIPHENHYDRAMINE HCL 50 MG/ML IJ SOLN: 12.5000 mg | Freq: Four times a day (QID) | INTRAMUSCULAR | Status: DC | PRN

## 2016-11-19 MED ORDER — PROPOFOL 10 MG/ML IV BOLUS
INTRAVENOUS | Status: DC | PRN
  Administered 2016-11-19: 200 mg via INTRAVENOUS

## 2016-11-19 MED ORDER — DOCUSATE SODIUM 100 MG PO CAPS
100.0000 mg | ORAL_CAPSULE | Freq: Two times a day (BID) | ORAL | Status: DC
  Administered 2016-11-19 – 2016-11-21 (×4): 100 mg via ORAL
  Filled 2016-11-19 (×4): qty 1

## 2016-11-19 MED ORDER — MIDAZOLAM HCL 5 MG/5ML IJ SOLN
INTRAMUSCULAR | Status: DC | PRN
  Administered 2016-11-19: 2 mg via INTRAVENOUS

## 2016-11-19 MED ORDER — MEPERIDINE HCL 50 MG/ML IJ SOLN: 6.2500 mg | INTRAMUSCULAR | Status: DC | PRN

## 2016-11-19 SURGICAL SUPPLY — 49 items
APPLICATOR SURGIFLO ENDO (HEMOSTASIS) ×3 IMPLANT
CHLORAPREP W/TINT 26ML (MISCELLANEOUS) ×3 IMPLANT
CLIP LIGATING HEM O LOK PURPLE (MISCELLANEOUS) ×3 IMPLANT
CLIP LIGATING HEMO O LOK GREEN (MISCELLANEOUS) ×6 IMPLANT
COVER SURGICAL LIGHT HANDLE (MISCELLANEOUS) ×3 IMPLANT
COVER TIP SHEARS 8 DVNC (MISCELLANEOUS) ×1 IMPLANT
COVER TIP SHEARS 8MM DA VINCI (MISCELLANEOUS) ×2
DECANTER SPIKE VIAL GLASS SM (MISCELLANEOUS) ×3 IMPLANT
DERMABOND ADVANCED (GAUZE/BANDAGES/DRESSINGS) ×2
DERMABOND ADVANCED .7 DNX12 (GAUZE/BANDAGES/DRESSINGS) ×1 IMPLANT
DRAIN CHANNEL 15F RND FF 3/16 (WOUND CARE) ×3 IMPLANT
DRAPE ARM DVNC X/XI (DISPOSABLE) ×4 IMPLANT
DRAPE COLUMN DVNC XI (DISPOSABLE) ×1 IMPLANT
DRAPE DA VINCI XI ARM (DISPOSABLE) ×8
DRAPE DA VINCI XI COLUMN (DISPOSABLE) ×2
DRAPE INCISE IOBAN 66X45 STRL (DRAPES) ×3 IMPLANT
DRAPE SHEET LG 3/4 BI-LAMINATE (DRAPES) ×3 IMPLANT
ELECT PENCIL ROCKER SW 15FT (MISCELLANEOUS) ×3 IMPLANT
ELECT REM PT RETURN 15FT ADLT (MISCELLANEOUS) ×3 IMPLANT
EVACUATOR SILICONE 100CC (DRAIN) ×3 IMPLANT
GLOVE BIO SURGEON STRL SZ 6.5 (GLOVE) ×2 IMPLANT
GLOVE BIO SURGEONS STRL SZ 6.5 (GLOVE) ×1
GLOVE BIOGEL M STRL SZ7.5 (GLOVE) ×6 IMPLANT
GOWN STRL REUS W/TWL LRG LVL3 (GOWN DISPOSABLE) ×9 IMPLANT
HEMOSTAT SURGICEL 4X8 (HEMOSTASIS) ×3 IMPLANT
IRRIG SUCT STRYKERFLOW 2 WTIP (MISCELLANEOUS) ×3
IRRIGATION SUCT STRKRFLW 2 WTP (MISCELLANEOUS) ×1 IMPLANT
KIT BASIN OR (CUSTOM PROCEDURE TRAY) ×3 IMPLANT
POSITIONER SURGICAL ARM (MISCELLANEOUS) ×6 IMPLANT
POUCH SPECIMEN RETRIEVAL 10MM (ENDOMECHANICALS) ×3 IMPLANT
SEAL CANN UNIV 5-8 DVNC XI (MISCELLANEOUS) ×4 IMPLANT
SEAL XI 5MM-8MM UNIVERSAL (MISCELLANEOUS) ×8
SOLUTION ELECTROLUBE (MISCELLANEOUS) ×3 IMPLANT
SURGIFLO W/THROMBIN 8M KIT (HEMOSTASIS) ×3 IMPLANT
SUT ETHILON 3 0 PS 1 (SUTURE) ×3 IMPLANT
SUT MNCRL AB 4-0 PS2 18 (SUTURE) ×6 IMPLANT
SUT V-LOC BARB 180 2/0GR6 GS22 (SUTURE) ×6
SUT VIC AB 0 CT1 27 (SUTURE) ×2
SUT VIC AB 0 CT1 27XBRD ANTBC (SUTURE) ×1 IMPLANT
SUT VICRYL 0 UR6 27IN ABS (SUTURE) IMPLANT
SUT VLOC BARB 180 ABS3/0GR12 (SUTURE) ×6
SUTURE V-LC BRB 180 2/0GR6GS22 (SUTURE) ×2 IMPLANT
SUTURE VLOC BRB 180 ABS3/0GR12 (SUTURE) ×2 IMPLANT
TOWEL OR 17X26 10 PK STRL BLUE (TOWEL DISPOSABLE) ×3 IMPLANT
TRAY FOLEY W/METER SILVER 16FR (SET/KITS/TRAYS/PACK) ×3 IMPLANT
TRAY LAPAROSCOPIC (CUSTOM PROCEDURE TRAY) ×3 IMPLANT
TROCAR BLADELESS OPT 5 100 (ENDOMECHANICALS) IMPLANT
TROCAR XCEL 12X100 BLDLESS (ENDOMECHANICALS) ×3 IMPLANT
WATER STERILE IRR 1000ML POUR (IV SOLUTION) ×3 IMPLANT

## 2016-11-19 NOTE — Interval H&P Note (Signed)
History and Physical Interval Note:  11/19/2016 9:58 AM  Tony Richardson  has presented today for surgery, with the diagnosis of RIGHT RENAL NEOPLASM  The various methods of treatment have been discussed with the patient and family. After consideration of risks, benefits and other options for treatment, the patient has consented to  Procedure(s): XI ROBOTIC ASSITED PARTIAL RIGHT NEPHRECTOMY (Right) INTAOPERATIVE ULTRASOUND (N/A) as a surgical intervention .  The patient's history has been reviewed, patient examined, no change in status, stable for surgery.  I have reviewed the patient's chart and labs.  Questions were answered to the patient's satisfaction.     [name],LES

## 2016-11-19 NOTE — Transfer of Care (Signed)
Immediate Anesthesia Transfer of Care Note  Patient: Tony Richardson  Procedure(s) Performed: Procedure(s): XI ROBOTIC ASSITED PARTIAL RIGHT NEPHRECTOMY (Right) INTAOPERATIVE ULTRASOUND (Right)  Patient Location: PACU  Anesthesia Type:General  Level of Consciousness: awake, alert  and oriented  Airway & Oxygen Therapy: Patient Spontanous Breathing and Patient connected to face mask oxygen  Post-op Assessment: Report given to RN and Post -op Vital signs reviewed and stable  Post vital signs: Reviewed and stable  Last Vitals:  Vitals:   11/19/16 0908 11/19/16 1353  BP: (!) 132/93   Pulse: 86   Resp: 18 (P) 16  Temp: 36.4 C     Last Pain:  Vitals:   11/19/16 0950  TempSrc:   PainSc: 0-No pain      Patients Stated Pain Goal: 3 (44/31/54 0086)  Complications: No apparent anesthesia complications

## 2016-11-19 NOTE — Anesthesia Procedure Notes (Signed)
Procedure Name: Intubation Date/Time: 11/19/2016 11:00 AM Performed by: West Pugh Pre-anesthesia Checklist: Patient identified, Emergency Drugs available, Suction available and Patient being monitored Patient Re-evaluated:Patient Re-evaluated prior to inductionOxygen Delivery Method: Circle system utilized Preoxygenation: Pre-oxygenation with 100% oxygen Intubation Type: IV induction Ventilation: Mask ventilation without difficulty Laryngoscope Size: Glidescope Grade View: Grade I Tube type: Oral Tube size: 7.5 mm Number of attempts: 1 Airway Equipment and Method: Stylet Placement Confirmation: ETT inserted through vocal cords under direct vision,  positive ETCO2 and breath sounds checked- equal and bilateral Secured at: 23 cm Tube secured with: Tape Dental Injury: Teeth and Oropharynx as per pre-operative assessment  Comments: Limited head extension due to recent ACDF - Glidescope utilized.

## 2016-11-19 NOTE — Discharge Instructions (Signed)

## 2016-11-19 NOTE — Anesthesia Preprocedure Evaluation (Addendum)
Anesthesia Evaluation  Patient identified by MRN, date of birth, ID band Patient awake    Reviewed: Allergy & Precautions, NPO status , Patient's Chart, lab work & pertinent test results  History of Anesthesia Complications (+) PONV and history of anesthetic complications  Airway Mallampati: II  TM Distance: >3 FB Neck ROM: Limited    Dental  (+) Teeth Intact, Dental Advisory Given   Pulmonary neg pulmonary ROS,    breath sounds clear to auscultation       Cardiovascular negative cardio ROS   Rhythm:Regular Rate:Normal     Neuro/Psych  Headaches, PSYCHIATRIC DISORDERS Anxiety Depression    GI/Hepatic negative GI ROS, Neg liver ROS,   Endo/Other  negative endocrine ROS  Renal/GU negative Renal ROS  negative genitourinary   Musculoskeletal  (+) Arthritis , Osteoarthritis,    Abdominal   Peds negative pediatric ROS (+)  Hematology negative hematology ROS (+)   Anesthesia Other Findings - Arnold-Chiari Deformity  Reproductive/Obstetrics negative OB ROS                            Anesthesia Physical Anesthesia Plan  ASA: II  Anesthesia Plan: General   Post-op Pain Management:    Induction: Intravenous  Airway Management Planned: Oral ETT and Video Laryngoscope Planned  Additional Equipment:   Intra-op Plan:   Post-operative Plan: Extubation in OR  Informed Consent: I have reviewed the patients History and Physical, chart, labs and discussed the procedure including the risks, benefits and alternatives for the proposed anesthesia with the patient or authorized representative who has indicated his/her understanding and acceptance.   Dental advisory given  Plan Discussed with: CRNA  Anesthesia Plan Comments:         Anesthesia Quick Evaluation

## 2016-11-19 NOTE — Progress Notes (Signed)
Patient ID: Tony Richardson, male   DOB: 1965/09/19, 51 y.o.   MRN: [id number]  Post-op note  Subjective: The patient is doing well.  No complaints.  Objective: Vital signs in last 24 hours: Temp:  [97.5 F (36.4 C)-97.7 F (36.5 C)] 97.7 F (36.5 C) (05/31 1430) Pulse Rate:  [63-86] 67 (05/31 1445) Resp:  [11-18] 12 (05/31 1445) BP: (127-149)/(69-97) 145/97 (05/31 1445) SpO2:  [92 %-98 %] 98 % (05/31 1445) Weight:  [68.5 kg (151 lb)] 68.5 kg (151 lb) (05/31 0911)  Intake/Output from previous day: No intake/output data recorded. Intake/Output this shift: Total I/O In: 1925 [I.V.:1925] Out: 580 [Urine:475; Drains:5; Other:50; Blood:50]  Physical Exam:  General: Alert and oriented. Abdomen: Soft, Nondistended. Incisions: Clean and dry.  Lab Results:  Recent Labs  11/19/16 1406  HGB 13.4  HCT 40.8    Assessment/Plan: POD#0   1) Continue to monitor   Pryor Curia. MD   LOS: 0 days   Rhilynn Preyer,LES 11/19/2016, 3:19 PM

## 2016-11-19 NOTE — Op Note (Addendum)
Preoperative diagnosis: Right renal neoplasms  Postoperative diagnosis: Right renal neoplasm  Procedure:  1. Right robotic-assisted laparoscopic partial nephrectomy 2. Right robotic-assisted laparoscopic renal cyst decortication 3. Intraoperative renal ultrasonography  Surgeon: Pryor Curia. M.D.  Assistant(s): Debbrah Alar, PA-C  An assistant was required for this surgical procedure.  The duties of the assistant included but were not limited to suctioning, passing suture, camera manipulation, retraction. This procedure would not be able to be performed without an Environmental consultant.  Anesthesia: General  Complications: None  EBL: 50 mL  IVF:  1700 mL crystalloid  Specimens: 1. Right lateral interpolar renal neoplasm 2. Right lower pole posterior renal neoplasm 3. Right anterior renal cyst wall  Disposition of specimens: Pathology  Intraoperative findings:       1. Warm renal ischemia time: 27 minutes       2. Intraoperative renal ultrasound findings: The lower pole renal neoplasm was noted to measure 1.9 cm and appeared as a solid mass on ultrasound.  The lateral interpolar mass was 2.1 cm and appeared heterogeneous and less defined but clearly not a simple cyst.  The anterior renal cyst appeared to have multiple mobile internal echoes consistent with hemorrhage.  Drains: 1. # 15 Blake perinephric drain  Indication:  JEFF FRIEDEN is a 51 y.o. year old patient with multiple right renal neoplasms with some concerning for malignancy.  Specifically, he has a lower pole right posterior renal mass that was highly suspicious and a less concerning ill-defined lateral interpolar renal mass.  After a thorough review of the management options for their renal mass, they elected to proceed with surgical treatment and the above procedure.  We have discussed the potential benefits and risks of the procedure, side effects of the proposed treatment, the likelihood of the patient achieving the  goals of the procedure, and any potential problems that might occur during the procedure or recuperation. Informed consent has been obtained.   Description of procedure:  The patient was taken to the operating room and a general anesthetic was administered. The patient was given preoperative antibiotics, placed in the right modified flank position with care to pad all potential pressure points, and prepped and draped in the usual sterile fashion. Next a preoperative timeout was performed.  A site was selected on in the midline for placement of the assistant port. This was placed using a standard open Hassan technique which allowed entry into the peritoneal cavity under direct vision and without difficulty. A 12 mm port was placed and a pneumoperitoneum established. The camera was then used to inspect the abdomen and there was no evidence of any intra-abdominal injuries or other abnormalities. The remaining abdominal ports were then placed. 8 mm robotic ports were placed in the right upper quadrant, right lower quadrant, and far right lateral abdominal wall. An 8 mm port was placed for the camera site just right of the umbilicus. All ports were placed under direct vision without difficulty. The surgical cart was then docked.   Utilizing the cautery scissors, the white line of Toldt was incised allowing the colon to be mobilized medially and the plane between the mesocolon and the anterior layer of Gerota's fascia to be developed and the kidney to be exposed.  The ureter and gonadal vein were identified inferiorly and the ureter was lifted anteriorly off the psoas muscle.  Dissection proceeded superiorly along the gonadal vein until the renal vein was identified.  The renal hilum was then carefully isolated with a combination of blunt  and sharp dissection allowing the renal arterial and venous structures to be separated and isolated in preparation for renal hilar vessel clamping. There was a single branching  renal artery and two separate renal veins with the artery located between the two veins.  Attention turned to the kidney and the perinephric fat surrounding the renal mass was removed and the kidney was mobilized sufficiently for exposure and resection of the renal mass.  There was noted to be an anterior renal cyst at the level of the renal hilum.  A small posterior, solid appearing mass was identified in the lower pole posteriorly and an ill-defined mass in the lateral interpolar kidney was identified with multiple small vessels located within the mass superficially.  Intraoperative renal ultrasonography was utilized with the laparoscopic ultrasound probe to identify the renal tumors of concern and identify the tumor margins.   Once the renal masses were properly isolated, preparations were made for resection of the tumor.  Reconstructive sutures were placed into the abdomen for the renorrhaphy portion of the procedure.  The renal artery was then clamped with bulldog clamps.  The lower pole posterior tumor was then excised with cold scissor dissection along with an adequate visible gross margin of normal renal parenchyma. The tumor appeared to be excised without any gross violation of the tumor. The renal collecting system was not obviously entered during removal of the tumor.  A running 3-0 V-lock suture was then brought through the capsule of the kidney and run along the base of the renal defect to provide hemostasis and close any entry into the renal collecting system if present. Weck clips were used to secure this suture outside the renal capsule at the proximal and distal ends.  Attention then turned to the lateral interpolar mass and it was similarly excised with cold scissor dissection and repaired with a 3-0 V-lock suture.  An additional hemostatic agent (Surgiflo) was then placed into both renal defects. A running 2-0 V lock suture was then used to close the capsule of the kidney of each defect  using a sliding clip technique which resulted in excellent hemostasis.    The bulldog clamps were then removed from the renal hilar vessel(s). Total warm renal ischemia time was 27 minutes. The renal tumor resection sites were examined. Hemostasis appeared adequate.   Attention then turned to the anterior renal cyst.  I decorticated this with a scissor to excise the cyst wall and it was removed as a specimen.  The cyst fluid was serous and not concerning nodular areas were noted.  The base of the cyst was cauterized and Surgiflo was placed into the cyst defect.  The kidney was placed back into its normal anatomic position and covered with perinephric fat as needed.  A # 26 Blake drain was then brought through the lateral lower port site and positioned in the perinephric space.  It was secured to the skin with a nylon suture. The surgical cart was undocked.  The renal tumor specimen was removed intact within an endopouch retrieval bag via the upper midline port site. This incision site was closed at the fascial layer with 0-vicryl suture. All other laparoscopic/robotic ports were removed under direct vision and the pneumoperitoneum let down with inspection of the operative field performed and hemostasis again confirmed. All incision sites were then injected with local anesthetic and reapproximated at the skin level with 4-0 monocryl subcuticular closures.  Liquiband was applied to the skin.  The patient tolerated the procedure well and without  complications.  The patient was able to be extubated and transferred to the recovery unit in satisfactory condition.  Pryor Curia MD

## 2016-11-20 ENCOUNTER — Encounter (HOSPITAL_COMMUNITY): Payer: Self-pay | Admitting: Urology

## 2016-11-20 LAB — BASIC METABOLIC PANEL
Anion gap: 9 (ref 5–15)
BUN: 9 mg/dL (ref 6–20)
CO2: 26 mmol/L (ref 22–32)
CREATININE: 1.12 mg/dL (ref 0.61–1.24)
Calcium: 7.9 mg/dL — ABNORMAL LOW (ref 8.9–10.3)
Chloride: 100 mmol/L — ABNORMAL LOW (ref 101–111)
GFR calc Af Amer: >60 mL/min (ref >60)
GLUCOSE: 147 mg/dL — AB (ref 65–99)
POTASSIUM: 3.3 mmol/L — AB (ref 3.5–5.1)
Sodium: 135 mmol/L (ref 135–145)

## 2016-11-20 LAB — CREATININE, FLUID (PLEURAL, PERITONEAL, JP DRAINAGE): Creat, Fluid: 1.2 mg/dL

## 2016-11-20 LAB — HEMOGLOBIN AND HEMATOCRIT, BLOOD
HCT: 36 % — ABNORMAL LOW (ref 39.0–52.0)
HEMOGLOBIN: 12 g/dL — AB (ref 13.0–17.0)

## 2016-11-20 MED ORDER — BISACODYL 10 MG RE SUPP
10.0000 mg | Freq: Once | RECTAL | Status: AC
  Administered 2016-11-20: 10 mg via RECTAL
  Filled 2016-11-20: qty 1

## 2016-11-20 MED ORDER — MORPHINE SULFATE (PF) 2 MG/ML IV SOLN
2.0000 mg | INTRAVENOUS | Status: DC | PRN
  Administered 2016-11-20 (×4): 4 mg via INTRAVENOUS
  Filled 2016-11-20 (×4): qty 2

## 2016-11-20 MED ORDER — HYDROCODONE-ACETAMINOPHEN 5-325 MG PO TABS
1.0000 | ORAL_TABLET | Freq: Four times a day (QID) | ORAL | Status: DC | PRN
  Administered 2016-11-20 (×3): 2 via ORAL
  Administered 2016-11-21: 1 via ORAL
  Administered 2016-11-21: 2 via ORAL
  Filled 2016-11-20 (×2): qty 2
  Filled 2016-11-20: qty 1
  Filled 2016-11-20 (×2): qty 2

## 2016-11-20 NOTE — Progress Notes (Signed)
Patient ID: Tony Richardson, male   DOB: Dec 04, 1965, 51 y.o.   MRN: [id number]   Pt doing well.  No complaints. Ambulating and tolerating diet.  Po pain meds are controlling pain. ORT OF SURGICAL PATHOLOGAL DIAGNOSIS Path report - Diagnosis 1. Kidney, wedge excision / partial resection, right posterior lower pole - CLEAR CELL RENAL CELL CARCINOMA, FUHRMAN GRADE 2, SPANNING 1.5 CM. - LIMITED TO KIDNEY. - RESECTION MARGINS ARE NEGATIVE. - SEE ONCOLOGY TABLE. 2. Kidney, wedge excision / partial resection, right lateral interpolar - FIBROSIS, SEE COMMENT. - NO MALIGNANCY IDENTIFIED. 3. Cyst, excision, kidney right wall - SIMPLE CYST. - NO MALIGNANCY.  I have discussed pathology report and implications of this report today.  His prognosis is good.  Plan: -SL IVF - Drain Cr sent - Plan for d/c in am if doing well

## 2016-11-20 NOTE — Progress Notes (Signed)
Patient ID: Tony Richardson, male   DOB: [birth year], 51 y.o.   MRN: [id number]  1 Day Post-Op Subjective: Pt doing well.  No complaints.  Pain well controlled.  Objective: Vital signs in last 24 hours: Temp:  [97.4 F (36.3 C)-98.9 F (37.2 C)] 98.5 F (36.9 C) (06/01 0606) Pulse Rate:  [63-86] 68 (06/01 0606) Resp:  [11-18] 18 (06/01 0606) BP: (124-149)/(69-97) 124/75 (06/01 0606) SpO2:  [92 %-100 %] 98 % (06/01 0606) Weight:  [68.5 kg (151 lb)] 68.5 kg (151 lb) (05/31 0911)  Intake/Output from previous day: 05/31 0701 - 06/01 0700 In: 4386.3 [P.O.:240; I.V.:3891.3; IV Piggyback:200] Out: 1510 [WTUUE:2800; Drains:35; Blood:50] Intake/Output this shift: No intake/output data recorded.  Physical Exam:  General: Alert and oriented CV: RRR Lungs: Clear Abdomen: Soft, ND, Positive BS Incisions: C/D/I Ext: NT, No erythema  Lab Results:  Recent Labs  11/19/16 1406 11/20/16 0516  HGB 13.4 12.0*  HCT 40.8 36.0*   BMET  Recent Labs  11/19/16 1406 11/20/16 0516  NA 140 135  K 3.7 3.3*  CL 104 100*  CO2 25 26  GLUCOSE 138* 147*  BUN 10 9  CREATININE 1.07 1.12  CALCIUM 8.6* 7.9*     Studies/Results: No results found.  Assessment/Plan: POD #1 s/p right RAL partial nephrectomy - Ambulate, IS, DVT prophylaxis - Advance diet as tolerated - Dulcolax suppository - Decreased IVF - D/C Foley - Transition to oral pain meds - Monitor renal function   LOS: 1 day   Linet Brash,LES 11/20/2016, 7:43 AM

## 2016-11-20 NOTE — Discharge Summary (Signed)
Date of admission: 11/19/2016  Date of discharge: 11/21/2016  Admission diagnosis: Right renal neoplasm  Discharge diagnosis: Renal cell carcinoma  Secondary diagnoses: None  History and Physical: For full details, please see admission history and physical. Briefly, Tony Richardson is a 51 y.o. year old patient with an enhancing right renal mass.  He elected surgical therapy.   Hospital Course: He underwent a R RAL partial nephrectomy for two renal masses on 11/19/16.  He tolerated the procedure well and remained hemodynamically stable overnight.  His renal function remained stable. On POD#1, he was able to ambulate and tolerate a regular diet.  His pain medication was transitioned to oral pain medications.  His drain fluid was sent for a creatinine level and returned consistent with serum and was removed prior to discharge on POD # 2.  PE: AF VSS NAD Soft approp t mild d Inc c/d/i No foley  Laboratory values:  Recent Labs  11/19/16 1406 11/20/16 0516  HGB 13.4 12.0*  HCT 40.8 36.0*    Recent Labs  11/19/16 1406 11/20/16 0516  CREATININE 1.07 1.12    Disposition: Home  Discharge instruction: The patient was instructed to be ambulatory but told to refrain from heavy lifting, strenuous activity, or driving.  Discharge medications:  Allergies as of 11/20/2016      Reactions   Other Other (See Comments)   Per Pt needs to avoid STEROIDs products due to his eyes.  UNSPECIFIED REACTION       Medication List    STOP taking these medications   ibuprofen 200 MG tablet Commonly known as:  ADVIL,MOTRIN     TAKE these medications   busPIRone 10 MG tablet Commonly known as:  BUSPAR Take 10 mg by mouth daily as needed (anxiety).   gabapentin 400 MG capsule Commonly known as:  NEURONTIN Take 400 mg by mouth 2 (two) times daily.   HYDROcodone-acetaminophen 5-325 MG tablet Commonly known as:  NORCO/VICODIN Take 1-2 tablets by mouth every 4 (four) hours as needed for moderate  pain or severe pain. What changed:  reasons to take this   hydrOXYzine 50 MG capsule Commonly known as:  VISTARIL Take 1 capsule (50 mg total) by mouth every 4 (four) hours as needed for nausea or vomiting.   tiZANidine 2 MG tablet Commonly known as:  ZANAFLEX Take 1 tab PO morning and noon. Take 2 tablets PO at bedtime. What changed:  how much to take  how to take this  when to take this  additional instructions       Followup:  Follow-up Information    Raynelle Bring, MD Follow up on 12/09/2016.   Specialty:  Urology Why:  at 12:30 Contact information: [place]  [id number] [phone]

## 2016-11-20 NOTE — Anesthesia Postprocedure Evaluation (Signed)
Anesthesia Post Note  Patient: Tony Richardson  Procedure(s) Performed: Procedure(s) (LRB): XI ROBOTIC ASSITED PARTIAL RIGHT NEPHRECTOMY (Right) INTAOPERATIVE ULTRASOUND (Right)     Patient location during evaluation: PACU Anesthesia Type: General Level of consciousness: awake and alert Pain management: pain level controlled Vital Signs Assessment: post-procedure vital signs reviewed and stable Respiratory status: spontaneous breathing, nonlabored ventilation, respiratory function stable and patient connected to nasal cannula oxygen Cardiovascular status: blood pressure returned to baseline and stable Postop Assessment: no signs of nausea or vomiting Anesthetic complications: no    Last Vitals:  Vitals:   11/20/16 0606 11/20/16 0809  BP: 124/75 140/87  Pulse: 68 71  Resp: 18   Temp: 36.9 C 36.9 C    Last Pain:  Vitals:   11/20/16 0809  TempSrc: Oral  PainSc:                  Effie Berkshire

## 2016-11-21 LAB — BASIC METABOLIC PANEL
Anion gap: 8 (ref 5–15)
BUN: 7 mg/dL (ref 6–20)
CALCIUM: 8.3 mg/dL — AB (ref 8.9–10.3)
CO2: 25 mmol/L (ref 22–32)
CREATININE: 1.21 mg/dL (ref 0.61–1.24)
Chloride: 103 mmol/L (ref 101–111)
GFR calc non Af Amer: >60 mL/min (ref >60)
GLUCOSE: 125 mg/dL — AB (ref 65–99)
Potassium: 3.4 mmol/L — ABNORMAL LOW (ref 3.5–5.1)
Sodium: 136 mmol/L (ref 135–145)

## 2016-11-21 LAB — GLUCOSE, CAPILLARY: Glucose-Capillary: 90 mg/dL (ref 65–99)

## 2016-11-21 LAB — HEMOGLOBIN AND HEMATOCRIT, BLOOD
HCT: 36.5 % — ABNORMAL LOW (ref 39.0–52.0)
Hemoglobin: 12.2 g/dL — ABNORMAL LOW (ref 13.0–17.0)

## 2016-12-25 ENCOUNTER — Other Ambulatory Visit (HOSPITAL_COMMUNITY): Payer: Self-pay | Admitting: Urology

## 2016-12-25 DIAGNOSIS — C641 Malignant neoplasm of right kidney, except renal pelvis: Secondary | ICD-10-CM

## 2016-12-25 DIAGNOSIS — D3002 Benign neoplasm of left kidney: Secondary | ICD-10-CM

## 2016-12-28 ENCOUNTER — Telehealth: Payer: Self-pay | Admitting: Adult Health

## 2016-12-28 NOTE — Telephone Encounter (Signed)
Patient has been taking tiZANidine (ZANAFLEX) 2 MG tablet for 2 months and does not seem to help with headaches. Please call and discuss.

## 2016-12-28 NOTE — Telephone Encounter (Signed)
Spoke with patient for clarification of medications he is taking. He stated he continues to take Hydrocodone 5-325 mg,  1/2 tab twice daily. He is not taking trazadone, and he is taking Tizanidine as prescribed. He stated approximately one month ago he stopped taking Gabapentin twice a day because with Hydrocodone he was taking "it was too much". He stated he has contacted provider who prescribes gabapentin to get refills for twice a day dosing. He agreed with this RN that when he gets back on gabapentin twice a day his headaches may again be well controlled. He agreed to continue with getting gabapentin refill through prescribing physician, get back on it twice daily and see if headaches get under control. He will update this office as needed on his headaches. He verbalized understanding, agreement.

## 2017-01-20 ENCOUNTER — Ambulatory Visit
Admission: RE | Admit: 2017-01-20 | Discharge: 2017-01-20 | Disposition: A | Discharge status: Home / Self Care | Payer: Medicaid Other | Source: Ambulatory Visit | Attending: Family Medicine | Admitting: Family Medicine

## 2017-01-20 DIAGNOSIS — E041 Nontoxic single thyroid nodule: Secondary | ICD-10-CM

## 2017-01-25 ENCOUNTER — Other Ambulatory Visit: Payer: Self-pay | Admitting: Family Medicine

## 2017-01-25 DIAGNOSIS — E041 Nontoxic single thyroid nodule: Secondary | ICD-10-CM

## 2017-03-02 ENCOUNTER — Ambulatory Visit (HOSPITAL_COMMUNITY)
Admission: RE | Admit: 2017-03-02 | Discharge: 2017-03-02 | Disposition: A | Discharge status: Home / Self Care | Payer: Medicaid Other | Source: Ambulatory Visit | Attending: Urology | Admitting: Urology

## 2017-03-02 DIAGNOSIS — C641 Malignant neoplasm of right kidney, except renal pelvis: Secondary | ICD-10-CM | POA: Diagnosis present

## 2017-03-02 DIAGNOSIS — D3002 Benign neoplasm of left kidney: Secondary | ICD-10-CM

## 2017-03-02 MED ORDER — TECHNETIUM TC 99M MERTIATIDE
4.8500 | Freq: Once | INTRAVENOUS | Status: AC | PRN
  Administered 2017-03-02: 4.85 via INTRAVENOUS

## 2017-03-02 MED ORDER — FUROSEMIDE 10 MG/ML IJ SOLN
INTRAMUSCULAR | Status: AC
  Filled 2017-03-02: qty 4

## 2017-03-02 MED ORDER — FUROSEMIDE 10 MG/ML IJ SOLN
34.0000 mg | Freq: Once | INTRAMUSCULAR | Status: AC
  Administered 2017-03-02: 34 mg via INTRAVENOUS

## 2017-03-05 ENCOUNTER — Ambulatory Visit (HOSPITAL_COMMUNITY)
Admission: RE | Admit: 2017-03-05 | Discharge: 2017-03-05 | Disposition: A | Discharge status: Home / Self Care | Payer: Medicaid Other | Source: Ambulatory Visit | Attending: Urology | Admitting: Urology

## 2017-03-05 DIAGNOSIS — C641 Malignant neoplasm of right kidney, except renal pelvis: Secondary | ICD-10-CM | POA: Diagnosis not present

## 2017-03-05 DIAGNOSIS — I700 Atherosclerosis of aorta: Secondary | ICD-10-CM | POA: Diagnosis not present

## 2017-03-05 DIAGNOSIS — N2889 Other specified disorders of kidney and ureter: Secondary | ICD-10-CM | POA: Diagnosis not present

## 2017-03-05 DIAGNOSIS — Z905 Acquired absence of kidney: Secondary | ICD-10-CM | POA: Diagnosis not present

## 2017-03-05 LAB — POCT I-STAT CREATININE: CREATININE: 1 mg/dL (ref 0.61–1.24)

## 2017-03-05 MED ORDER — GADOBENATE DIMEGLUMINE 529 MG/ML IV SOLN
15.0000 mL | Freq: Once | INTRAVENOUS | Status: AC | PRN
  Administered 2017-03-05: 14 mL via INTRAVENOUS

## 2017-03-09 ENCOUNTER — Other Ambulatory Visit: Payer: Self-pay | Admitting: Adult Health

## 2017-05-17 ENCOUNTER — Encounter: Payer: Self-pay | Admitting: Neurology

## 2017-05-17 ENCOUNTER — Ambulatory Visit: Payer: Medicaid Other | Admitting: Neurology

## 2017-05-17 VITALS — BP 127/86 | HR 73 | Ht 67.5 in | Wt 148.0 lb

## 2017-05-17 DIAGNOSIS — G4489 Other headache syndrome: Secondary | ICD-10-CM | POA: Diagnosis not present

## 2017-05-17 DIAGNOSIS — Q0700 Arnold-Chiari syndrome without spina bifida or hydrocephalus: Secondary | ICD-10-CM | POA: Diagnosis not present

## 2017-05-17 MED ORDER — TIZANIDINE HCL 4 MG PO TABS: 4.0000 mg | ORAL_TABLET | Freq: Three times a day (TID) | ORAL | 3 refills | Status: DC

## 2017-05-17 NOTE — Progress Notes (Signed)
Reason for visit: Chronic cervicogenic headache  Tony Richardson is an 51 y.o. male  History of present illness:  Tony Richardson is a 51 year old right-handed white male with a history of Arnold-Chiari malformation, status post suboccipital craniectomy, he has had cervical spine surgery as well, the last procedure was in January 2018.  The patient had fusion of the cervical spine at that time, the surgery did not worsen the headache.  The patient will have some discomfort into the shoulders and some pain into the upper arms, some intermittent numbness and tingling in the hands and sometimes in the left leg.  The patient has not noted any change in his occipital headaches.  He has tried baclofen in the past, he is now on tizanidine which is well-tolerated and seems to help some, particularly in combination with the Ultram.  The patient takes 50 mg of Ultram 4 times daily.  The gabapentin 400 mg capsules cause a lot of drowsiness during the day, he takes one at night.  He returns for an evaluation.  Past Medical History:  Diagnosis Date  . Anxiety   . Arnold-Chiari deformity ([place]) 04/19/2015   lots of head pain  . Arthritis   . Chronic back pain    spondylolisthesis  . Depression   . Dizziness   . Facial numbness 04/19/2015  . History of blood transfusion    2 at birth.Issue was b/c of parents blood types per pt  . History of bronchitis    many yrs ago  . History of colon polyps    benign  . History of shingles   . Nocturia   . Pneumonia    hx of about 5-6 yrs ago  . PONV (postoperative nausea and vomiting)    last procedure with neck fusion ; "i got sick"   . Urinary frequency    d/t prostate infection 15 yrs ago   . Weakness    numbness and tingling both hands and feet    Past Surgical History:  Procedure Laterality Date  . ANTERIOR CERVICAL DECOMP/DISCECTOMY FUSION N/A 09/07/2016   Procedure: Anterior Cervical Decompression Fusion - Cervical five-Cervical six - Cervical  six-Cervical seven;  Surgeon: Jovita Gamma, MD;  Location: [facility];  Service: Neurosurgery;  Laterality: N/A;  . COLONOSCOPY    . CRANIECTOMY SUBOCCIPITAL W/ CERVICAL LAMINECTOMY / CHIARI    . ESOPHAGOGASTRODUODENOSCOPY    . growth removed from back of right ear     as a child  . OPERATIVE ULTRASOUND Right 11/19/2016   Procedure: Sharlett Iles ULTRASOUND;  Surgeon: Raynelle Bring, MD;  Location: [facility];  Service: Urology;  Laterality: Right;  . ROBOTIC ASSITED PARTIAL NEPHRECTOMY Right 11/19/2016   Procedure: XI ROBOTIC ASSITED PARTIAL RIGHT NEPHRECTOMY;  Surgeon: Raynelle Bring, MD;  Location: [facility];  Service: Urology;  Laterality: Right;  . SUBOCCIPITAL CRANIECTOMY CERVICAL LAMINECTOMY N/A 04/07/2013   Procedure: SUBOCCIPITAL CRANIECTOMY CERVICAL LAMINECTOMY/DURAPLASTY RESECTION POSTERIOR CERVICAL ONE;  Surgeon: Floyce Stakes, MD;  Location: [facility];  Service: Neurosurgery;  Laterality: N/A;  . THUMB ARTHROSCOPY Right 1991    Family History  Problem Relation Age of Onset  . Hypertension Brother   . Colon cancer Maternal Aunt   . Colon cancer Paternal Aunt     Social history:  reports that  has never smoked. he has never used smokeless tobacco. He reports that he does not drink alcohol or use drugs.    Allergies  Allergen Reactions  . Other Other (See Comments)  Per Pt needs to avoid STEROIDs products due to his eyes.  UNSPECIFIED REACTION     Medications:  Prior to Admission medications   Medication Sig Start Date End Date Taking? Authorizing Provider  busPIRone (BUSPAR) 10 MG tablet Take 10 mg by mouth daily as needed (anxiety).    Yes [provider]  gabapentin (NEURONTIN) 400 MG capsule Take 400 mg by mouth 2 (two) times daily.   Yes [provider]  hydrOXYzine (VISTARIL) 50 MG capsule Take 1 capsule (50 mg total) by mouth every 4 (four) hours as needed for nausea or vomiting. 11/16/15  Yes Jovita Gamma, MD  tiZANidine (ZANAFLEX) 2 MG tablet  TAKE 1 TABLET BY MOUTH AT MORNING AND NOON, THEN 2 TABLETS BY MOUTH AT BEDTIME. 03/09/17  Yes Millikan, Megan, NP  traMADol (ULTRAM) 50 MG tablet Take 50 mg by mouth every 6 (six) hours as needed.   Yes [provider]    ROS:  Out of a complete 14 system review of symptoms, the patient complains only of the following symptoms, and all other reviewed systems are negative.  Appetite change Insomnia, snoring Frequency of urination Back pain, neck pain, neck stiffness Headache, numbness Depression, anxiety  Blood pressure 127/86, pulse 73, height 5' 7.5" (1.715 m), weight 148 lb (67.1 kg).  Physical Exam  General: The patient is alert and cooperative at the time of the examination.  Neuromuscular: The patient lacks about 30-40 degrees of full lateral rotation of the cervical spine bilaterally, he has some restriction with extension of the neck.  Skin: No significant peripheral edema is noted.   Neurologic Exam  Mental status: The patient is alert and oriented x 3 at the time of the examination. The patient has apparent normal recent and remote memory, with an apparently normal attention span and concentration ability.   Cranial nerves: Facial symmetry is present. Speech is normal, no aphasia or dysarthria is noted. Extraocular movements are full. Visual fields are full.  Motor: The patient has good strength in all 4 extremities.  Sensory examination: Soft touch sensation is symmetric on the face, arms, and legs.  Coordination: The patient has good finger-nose-finger and heel-to-shin bilaterally.  Gait and station: The patient has a normal gait. Tandem gait is normal. Romberg is negative. No drift is seen.  Reflexes: Deep tendon reflexes are symmetric.   Assessment/Plan:  1.  Chronic cervicogenic headache  The patient is on disability currently.  He is tolerating the tizanidine relatively well, we will go up on the dose to 4 mg 3 times daily.  He will do regular  stretching exercises of the neck and shoulders.  He will follow-up in 6 months, sooner if needed.  He recently had a partial nephrectomy for renal cell carcinoma.  Jill Alexanders MD 05/17/2017 10:43 AM  [facility] [address] [place], [place] [address]  Phone [phone] Fax [phone]

## 2017-05-17 NOTE — Patient Instructions (Signed)
   Go up on the tizanidine to 4 mg three times a day. Watch out for drowsiness.

## 2017-08-23 ENCOUNTER — Other Ambulatory Visit (HOSPITAL_COMMUNITY): Payer: Self-pay | Admitting: Urology

## 2017-08-23 DIAGNOSIS — C641 Malignant neoplasm of right kidney, except renal pelvis: Secondary | ICD-10-CM

## 2017-08-23 DIAGNOSIS — D4102 Neoplasm of uncertain behavior of left kidney: Secondary | ICD-10-CM

## 2017-08-23 DIAGNOSIS — D49512 Neoplasm of unspecified behavior of left kidney: Secondary | ICD-10-CM

## 2017-09-08 ENCOUNTER — Other Ambulatory Visit (HOSPITAL_COMMUNITY): Payer: Self-pay | Admitting: Urology

## 2017-09-08 ENCOUNTER — Ambulatory Visit (HOSPITAL_COMMUNITY)
Admission: RE | Admit: 2017-09-08 | Discharge: 2017-09-08 | Disposition: A | Discharge status: Home / Self Care | Payer: BLUE CROSS/BLUE SHIELD | Source: Ambulatory Visit | Attending: Urology | Admitting: Urology

## 2017-09-08 DIAGNOSIS — Z85528 Personal history of other malignant neoplasm of kidney: Secondary | ICD-10-CM | POA: Diagnosis not present

## 2017-09-08 DIAGNOSIS — C649 Malignant neoplasm of unspecified kidney, except renal pelvis: Secondary | ICD-10-CM | POA: Diagnosis not present

## 2017-09-08 DIAGNOSIS — N2889 Other specified disorders of kidney and ureter: Secondary | ICD-10-CM | POA: Insufficient documentation

## 2017-09-08 DIAGNOSIS — C641 Malignant neoplasm of right kidney, except renal pelvis: Secondary | ICD-10-CM

## 2017-09-08 DIAGNOSIS — D4102 Neoplasm of uncertain behavior of left kidney: Secondary | ICD-10-CM

## 2017-09-08 DIAGNOSIS — D49512 Neoplasm of unspecified behavior of left kidney: Secondary | ICD-10-CM

## 2017-09-08 LAB — POCT I-STAT CREATININE: Creatinine, Ser: 1.1 mg/dL (ref 0.61–1.24)

## 2017-09-08 MED ORDER — GADOBENATE DIMEGLUMINE 529 MG/ML IV SOLN
15.0000 mL | Freq: Once | INTRAVENOUS | Status: AC | PRN
  Administered 2017-09-08: 15 mL via INTRAVENOUS

## 2017-09-15 DIAGNOSIS — Z85528 Personal history of other malignant neoplasm of kidney: Secondary | ICD-10-CM | POA: Diagnosis not present

## 2017-09-15 DIAGNOSIS — D49512 Neoplasm of unspecified behavior of left kidney: Secondary | ICD-10-CM | POA: Diagnosis not present

## 2017-09-17 DIAGNOSIS — M5412 Radiculopathy, cervical region: Secondary | ICD-10-CM | POA: Diagnosis not present

## 2017-09-17 DIAGNOSIS — I10 Essential (primary) hypertension: Secondary | ICD-10-CM | POA: Diagnosis not present

## 2017-09-17 DIAGNOSIS — M5030 Other cervical disc degeneration, unspecified cervical region: Secondary | ICD-10-CM | POA: Diagnosis not present

## 2017-10-20 IMAGING — DX DG CHEST 2V
2 series · 2 of 2 positions shown · non-contrast
Comparison: Chest x-ray of 04/15/2013

CLINICAL DATA: History of left renal cell carcinoma

EXAM:
CHEST  2 VIEW

[chest pa]
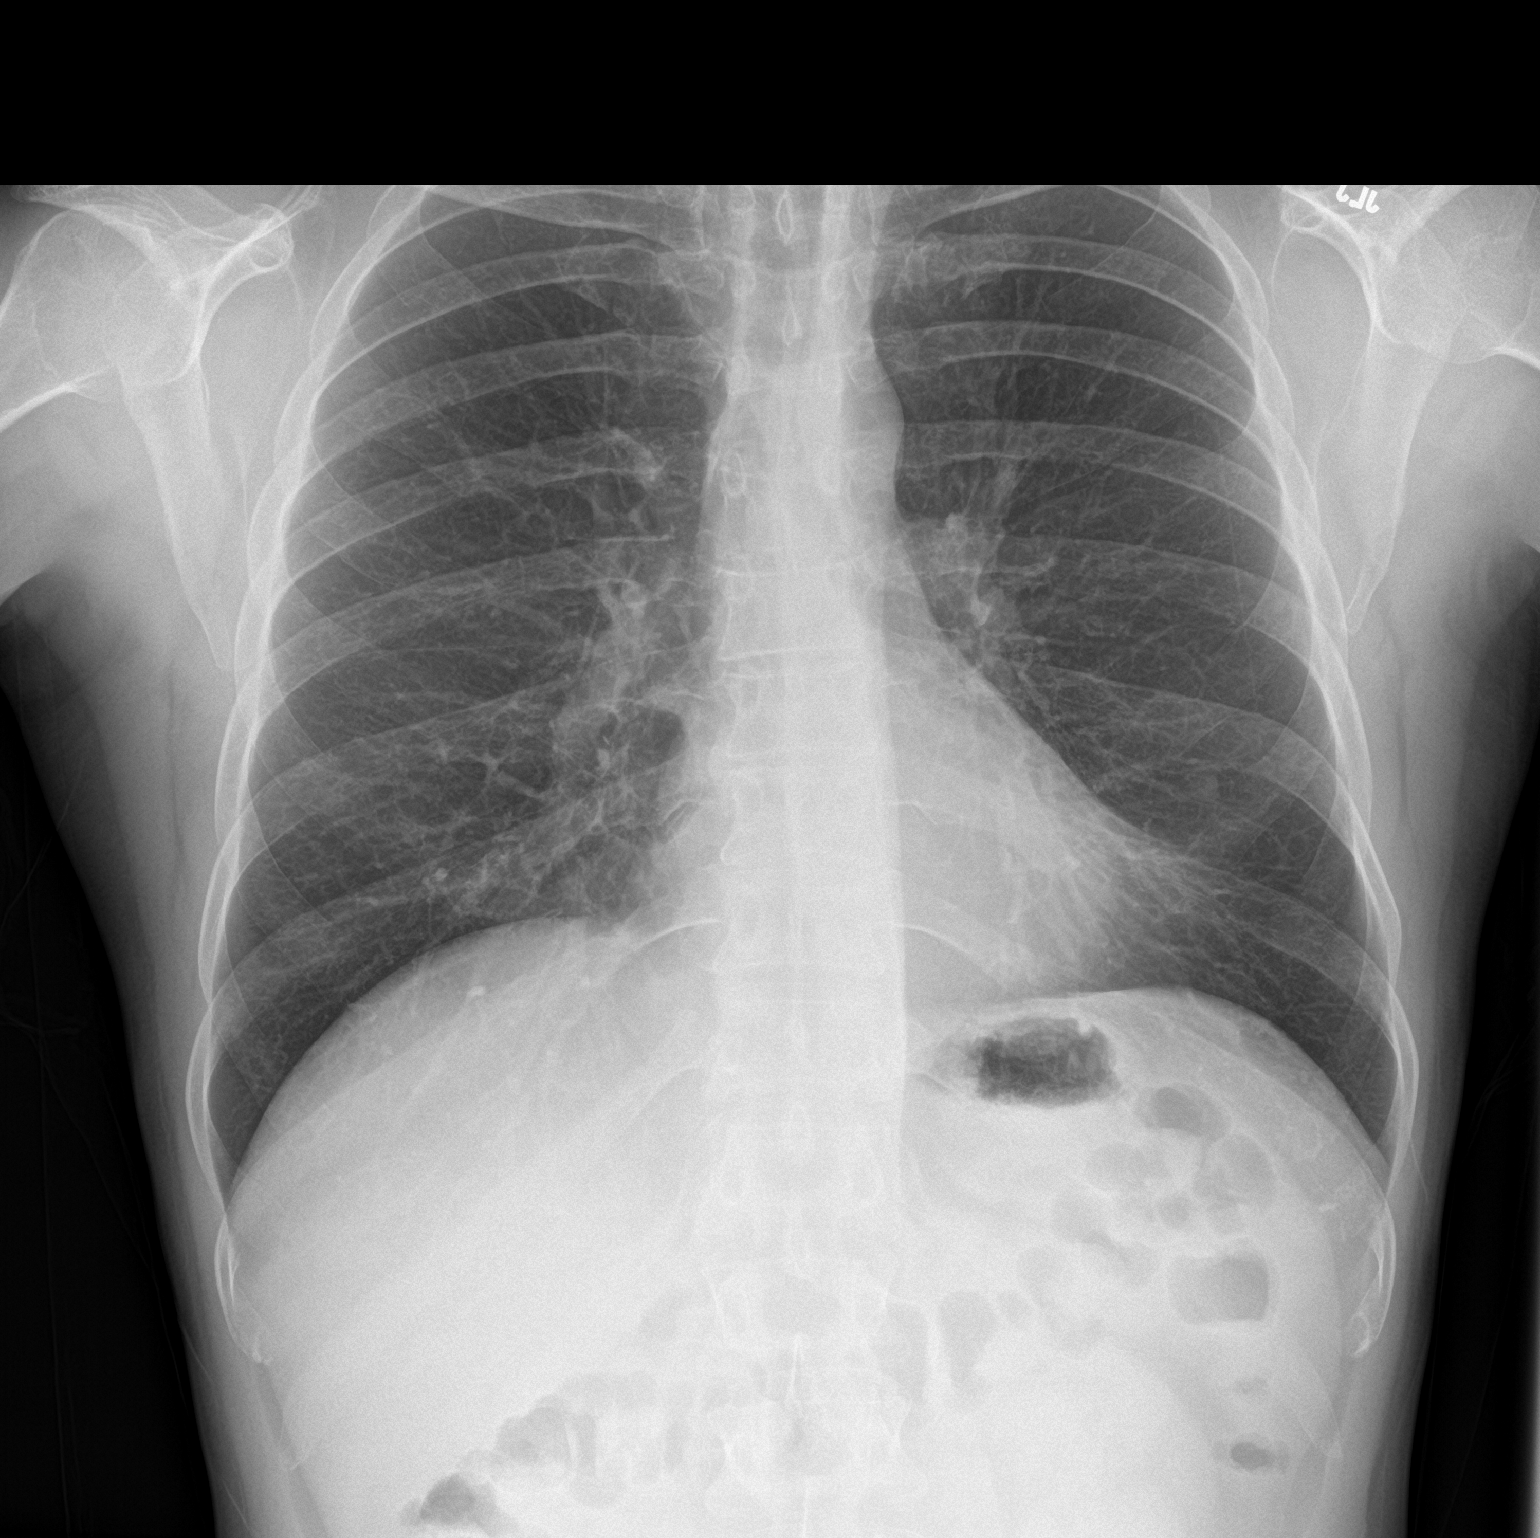

[chest lat]
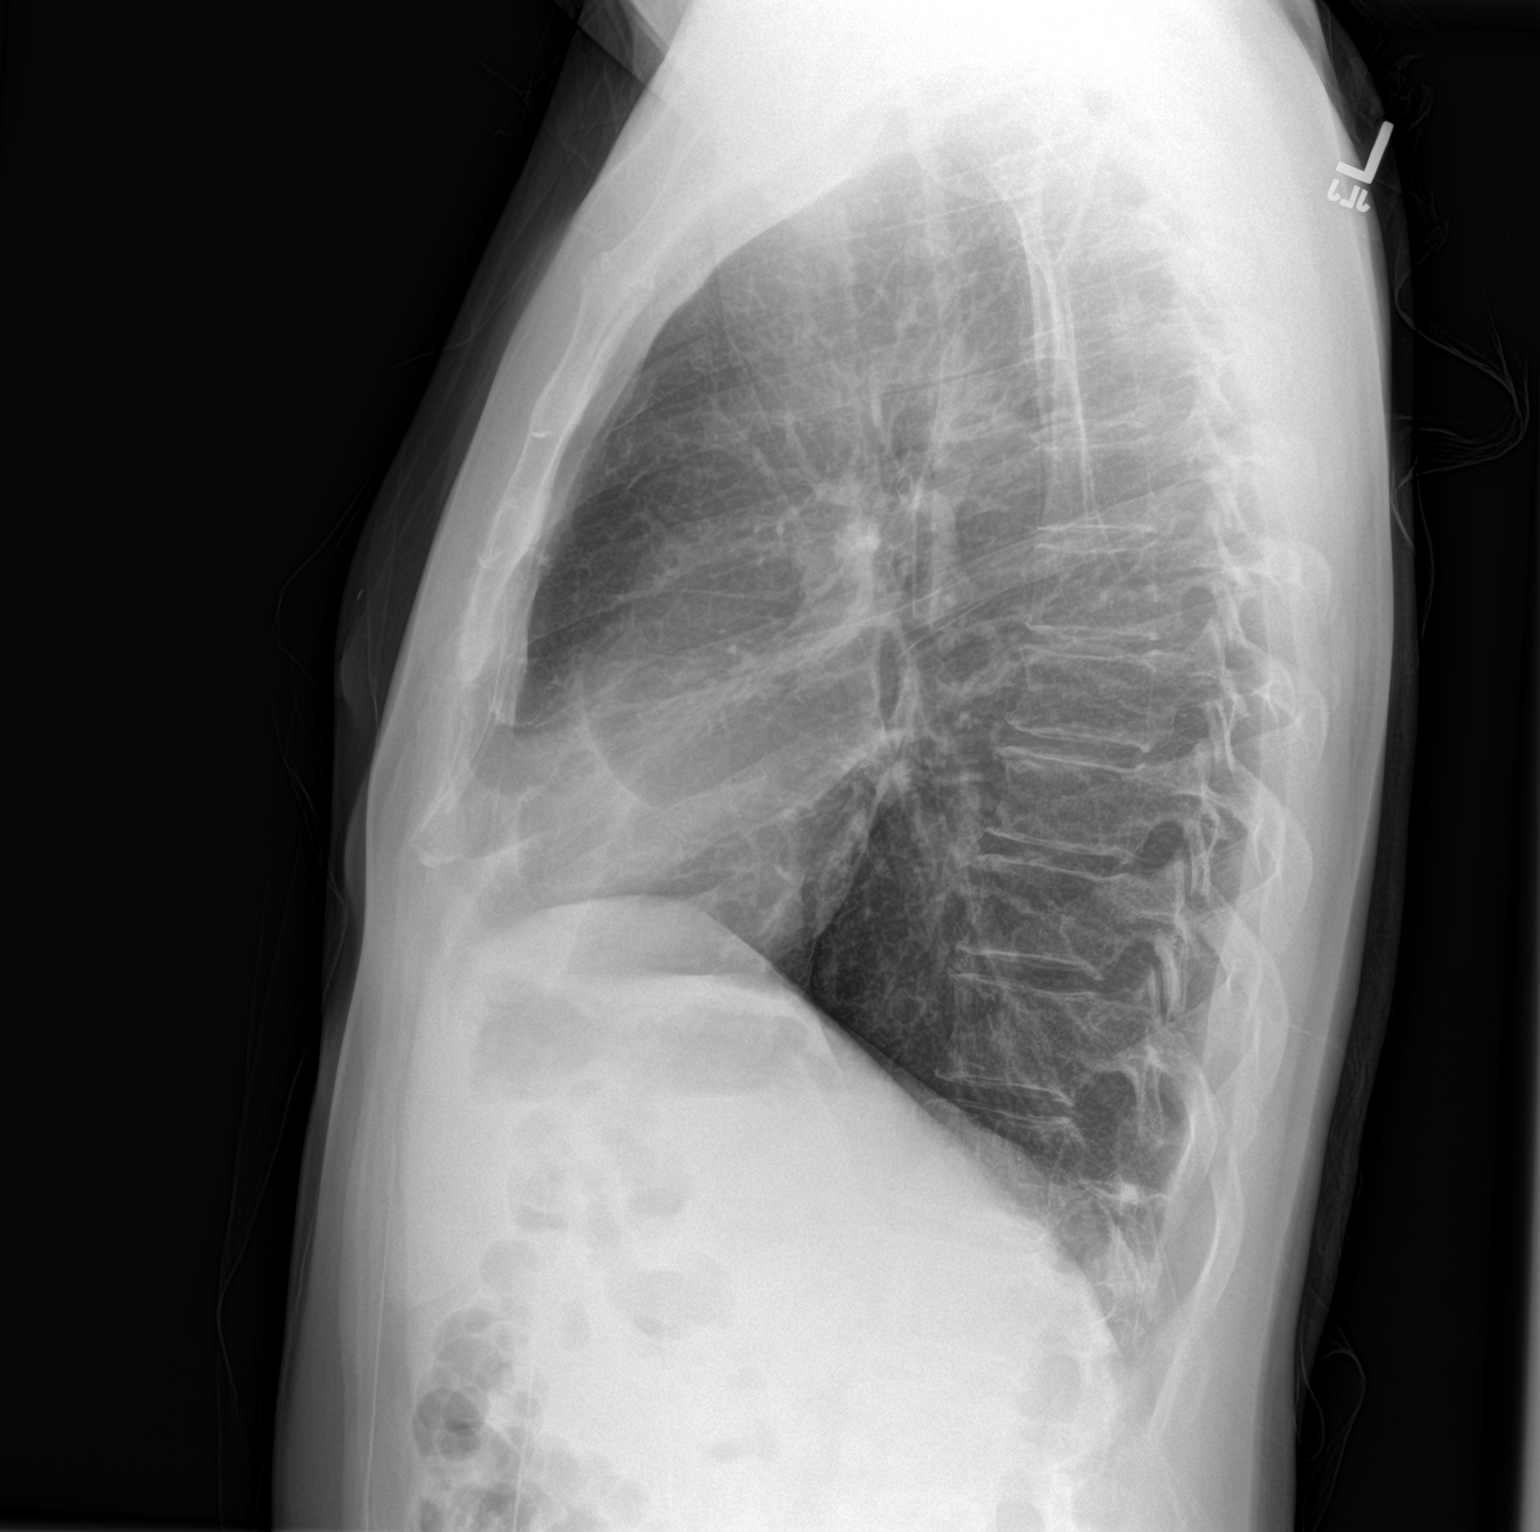

[2 of 2 positions shown; findings below may reference images not displayed]

FINDINGS: No active infiltrate or effusion is seen. There is no change in
nipple shadows at the lung bases when compared to the prior chest
x-ray. Mediastinal and hilar contours are unremarkable. The heart is
within normal limits in size. No bony abnormality is seen. A lower
anterior cervical spine fusion plate is present.
IMPRESSION: 1. No active lung disease.
2. No evidence of metastatic involvement of the lungs.

## 2017-11-18 ENCOUNTER — Ambulatory Visit (INDEPENDENT_AMBULATORY_CARE_PROVIDER_SITE_OTHER): Payer: BLUE CROSS/BLUE SHIELD | Admitting: Adult Health

## 2017-11-18 ENCOUNTER — Encounter: Payer: Self-pay | Admitting: Adult Health

## 2017-11-18 VITALS — BP 142/80 | HR 90 | Ht 67.5 in | Wt 163.8 lb

## 2017-11-18 DIAGNOSIS — G4489 Other headache syndrome: Secondary | ICD-10-CM

## 2017-11-18 DIAGNOSIS — Q0700 Arnold-Chiari syndrome without spina bifida or hydrocephalus: Secondary | ICD-10-CM | POA: Diagnosis not present

## 2017-11-18 MED ORDER — TIZANIDINE HCL 2 MG PO TABS: 2.0000 mg | ORAL_TABLET | Freq: Three times a day (TID) | ORAL | 5 refills | Status: DC

## 2017-11-18 NOTE — Progress Notes (Signed)
PATIENT: Tony Richardson DOB: Feb 06, 1966  REASON FOR VISIT: follow up HISTORY FROM: patient  HISTORY OF PRESENT ILLNESS: Today 11/18/17 Mr. Hanway is a 52 year old male with a history of Arnold-Chiari malformation with suboccipital craniectomy as well as headaches.  He returns today for follow-up.  He states that he always has a dull ache in the occipital region.  He reports that he did try taking tizanidine 4 mg 3 times a day but it made him too drowsy.  He also did not see any increase in the benefit.  He reduced his dose to 2 mg 3 times a day.  He reports that at this time his pain is manageable.  He does see a pain specialist who placed him on Nucynta.  He states that he does not use neck exercises and stretches regularly.  He reports he does try to stay active.  He returns today for evaluation.  HISTORY Mr. Hoback is a 52 year old right-handed white male with a history of Arnold-Chiari malformation, status post suboccipital craniectomy, he has had cervical spine surgery as well, the last procedure was in January 2018.  The patient had fusion of the cervical spine at that time, the surgery did not worsen the headache.  The patient will have some discomfort into the shoulders and some pain into the upper arms, some intermittent numbness and tingling in the hands and sometimes in the left leg.  The patient has not noted any change in his occipital headaches.  He has tried baclofen in the past, he is now on tizanidine which is well-tolerated and seems to help some, particularly in combination with the Ultram.  The patient takes 50 mg of Ultram 4 times daily.  The gabapentin 400 mg capsules cause a lot of drowsiness during the day, he takes one at night.  He returns for an evaluation.    REVIEW OF SYSTEMS: Out of a complete 14 system review of symptoms, the patient complains only of the following symptoms, and all other reviewed systems are negative.  Abdominal pain, frequency of urination, memory  loss, headache, numbness, weakness, depression, nervous/anxious, itching, neck stiffness, neck pain, walking difficulty, back pain, insomnia, frequent waking, daytime sleepiness, snoring  ALLERGIES: Allergies  Allergen Reactions  . Other Other (See Comments)    Per Pt needs to avoid STEROIDs products due to his eyes.  UNSPECIFIED REACTION     HOME MEDICATIONS: Outpatient Medications Prior to Visit  Medication Sig Dispense Refill  . busPIRone (BUSPAR) 10 MG tablet Take 10 mg by mouth daily as needed (anxiety).     . gabapentin (NEURONTIN) 400 MG capsule Take 400 mg by mouth 2 (two) times daily.    . NUCYNTA 50 MG tablet Take 50 mg by mouth every 8 (eight) hours as needed.   0  . tiZANidine (ZANAFLEX) 4 MG tablet Take 1 tablet (4 mg total) by mouth 3 (three) times daily. (Patient taking differently: Take 2 mg by mouth 3 (three) times daily. ) 270 tablet 3  . hydrOXYzine (VISTARIL) 50 MG capsule Take 1 capsule (50 mg total) by mouth every 4 (four) hours as needed for nausea or vomiting. 30 capsule 0  . traMADol (ULTRAM) 50 MG tablet Take 50 mg by mouth every 6 (six) hours as needed.     No facility-administered medications prior to visit.     PAST MEDICAL HISTORY: Past Medical History:  Diagnosis Date  . Anxiety   . Arnold-Chiari deformity ([place]) 04/19/2015   lots of head pain  .  Arthritis   . Chronic back pain    spondylolisthesis  . Depression   . Dizziness   . Facial numbness 04/19/2015  . History of blood transfusion    2 at birth.Issue was b/c of parents blood types per pt  . History of bronchitis    many yrs ago  . History of colon polyps    benign  . History of shingles   . Nocturia   . Pneumonia    hx of about 5-6 yrs ago  . PONV (postoperative nausea and vomiting)    last procedure with neck fusion ; "i got sick"   . Urinary frequency    d/t prostate infection 15 yrs ago   . Weakness    numbness and tingling both hands and feet    PAST SURGICAL HISTORY: Past  Surgical History:  Procedure Laterality Date  . ANTERIOR CERVICAL DECOMP/DISCECTOMY FUSION N/A 09/07/2016   Procedure: Anterior Cervical Decompression Fusion - Cervical five-Cervical six - Cervical six-Cervical seven;  Surgeon: Jovita Gamma, MD;  Location: [facility];  Service: Neurosurgery;  Laterality: N/A;  . COLONOSCOPY    . CRANIECTOMY SUBOCCIPITAL W/ CERVICAL LAMINECTOMY / CHIARI    . ESOPHAGOGASTRODUODENOSCOPY    . growth removed from back of right ear     as a child  . OPERATIVE ULTRASOUND Right 11/19/2016   Procedure: Sharlett Iles ULTRASOUND;  Surgeon: Raynelle Bring, MD;  Location: [facility];  Service: Urology;  Laterality: Right;  . ROBOTIC ASSITED PARTIAL NEPHRECTOMY Right 11/19/2016   Procedure: XI ROBOTIC ASSITED PARTIAL RIGHT NEPHRECTOMY;  Surgeon: Raynelle Bring, MD;  Location[facility];  Service: Urology;  Laterality: Right;  . SUBOCCIPITAL CRANIECTOMY CERVICAL LAMINECTOMY N/A 04/07/2013   Procedure: SUBOCCIPITAL CRANIECTOMY CERVICAL LAMINECTOMY/DURAPLASTY RESECTION POSTERIOR CERVICAL ONE;  Surgeon: Floyce Stakes, MD;  Location: [facility];  Service: Neurosurgery;  Laterality: N/A;  . THUMB ARTHROSCOPY Right 1991    FAMILY HISTORY: Family History  Problem Relation Age of Onset  . Hypertension Brother   . Colon cancer Maternal Aunt   . Colon cancer Paternal Aunt     SOCIAL HISTORY: Social History   Socioeconomic History  . Marital status: Married    Spouse name: Not on file  . Number of children: 1  . Years of education: 6  . Highest education level: Not on file  Occupational History  . Occupation: unemployed  Social Needs  . Financial resource strain: Not on file  . Food insecurity:    Worry: Not on file    Inability: Not on file  . Transportation needs:    Medical: Not on file    Non-medical: Not on file  Tobacco Use  . Smoking status: Never Smoker  . Smokeless tobacco: Never Used  Substance and Sexual Activity  . Alcohol use: No  . Drug use: No  .  Sexual activity: Yes  Lifestyle  . Physical activity:    Days per week: Not on file    Minutes per session: Not on file  . Stress: Not on file  Relationships  . Social connections:    Talks on phone: Not on file    Gets together: Not on file    Attends religious service: Not on file    Active member of club or organization: Not on file    Attends meetings of clubs or organizations: Not on file    Relationship status: Not on file  . Intimate partner violence:    Fear of current or ex partner: Not on file  Emotionally abused: Not on file    Physically abused: Not on file    Forced sexual activity: Not on file  Other Topics Concern  . Not on file  Social History Narrative   Patient drinks about 3-4 sodas daily.   Patient is right handed.       PHYSICAL EXAM  Vitals:   11/18/17 0903  BP: (!) 142/80  Pulse: 90  Weight: 163 lb 12.8 oz (74.3 kg)  Height: 5' 7.5" (1.715 m)   Body mass index is 25.28 kg/m.  Generalized: Well developed, in no acute distress   Neurological examination  Mentation: Alert oriented to time, place, history taking. Follows all commands speech and language fluent Cranial nerve II-XII: Pupils were equal round reactive to light. Extraocular movements were full, visual field were full on confrontational test. Facial sensation and strength were normal. Uvula tongue midline.  Limited range of motion in the neck. Motor: The motor testing reveals 5 over 5 strength of all 4 extremities. Good symmetric motor tone is noted throughout.  Sensory: Sensory testing is intact to soft touch on all 4 extremities. No evidence of extinction is noted.  Coordination: Cerebellar testing reveals good finger-nose-finger and heel-to-shin bilaterally.  Gait and station: Gait is normal.  Reflexes: Deep tendon reflexes are symmetric and normal bilaterally.   DIAGNOSTIC DATA (LABS, IMAGING, TESTING) - I reviewed patient records, labs, notes, testing and imaging myself where  available.  Lab Results  Component Value Date   WBC 7.5 11/12/2016   HGB 12.2 (L) 11/21/2016   HCT 36.5 (L) 11/21/2016   MCV 87.7 11/12/2016   PLT 167 11/12/2016      Component Value Date/Time   NA 136 11/21/2016 0512   NA 143 04/19/2015 1017   K 3.4 (L) 11/21/2016 0512   CL 103 11/21/2016 0512   CO2 25 11/21/2016 0512   GLUCOSE 125 (H) 11/21/2016 0512   BUN 7 11/21/2016 0512   BUN 11 04/19/2015 1017   CREATININE 1.10 09/08/2017 0920   CALCIUM 8.3 (L) 11/21/2016 0512   PROT 7.2 04/19/2015 1017   ALBUMIN 4.7 04/19/2015 1017   AST 17 04/19/2015 1017   ALT 15 04/19/2015 1017   ALKPHOS 95 04/19/2015 1017   BILITOT 0.4 04/19/2015 1017   GFRNONAA >60 11/21/2016 0512   GFRAA >60 11/21/2016 0512     ASSESSMENT AND PLAN 52 y.o. year old male  has a past medical history of Anxiety, Arnold-Chiari deformity ([place]) (04/19/2015), Arthritis, Chronic back pain, Depression, Dizziness, Facial numbness (04/19/2015), History of blood transfusion, History of bronchitis, History of colon polyps, History of shingles, Nocturia, Pneumonia, PONV (postoperative nausea and vomiting), Urinary frequency, and Weakness. here with:  1.  Headaches in the suboccipital region 2.  History of Arnold-Chiari malformation  The patient will remain on tizanidine 2 mg 3 times a day.  He is encouraged to do neck stretches and exercises regularly.  He is advised that if his symptoms worsen or he develops new symptoms he should let us know.  He will follow-up in 6 months or sooner if needed.    Ward Givens, MSN, NP-C 11/18/2017, 9:24 AM [facility] [address] [place], [place] [address] [phone]

## 2017-11-18 NOTE — Patient Instructions (Signed)
Your Plan:  Continue Tizanidine If your symptoms worsen or you develop new symptoms please let us know.   Thank you for coming to see [place] at [facility]. I hope we have been able to provide you high quality care today.  You may receive a patient satisfaction survey over the next few weeks. We would appreciate your feedback and comments so that we may continue to improve ourselves and the health of our patients.

## 2017-11-22 DIAGNOSIS — M5412 Radiculopathy, cervical region: Secondary | ICD-10-CM | POA: Diagnosis not present

## 2017-11-22 DIAGNOSIS — M5030 Other cervical disc degeneration, unspecified cervical region: Secondary | ICD-10-CM | POA: Diagnosis not present

## 2018-02-09 DIAGNOSIS — R030 Elevated blood-pressure reading, without diagnosis of hypertension: Secondary | ICD-10-CM | POA: Diagnosis not present

## 2018-02-09 DIAGNOSIS — M5412 Radiculopathy, cervical region: Secondary | ICD-10-CM | POA: Diagnosis not present

## 2018-02-09 DIAGNOSIS — M5030 Other cervical disc degeneration, unspecified cervical region: Secondary | ICD-10-CM | POA: Diagnosis not present

## 2018-02-09 DIAGNOSIS — Z6826 Body mass index (BMI) 26.0-26.9, adult: Secondary | ICD-10-CM | POA: Diagnosis not present

## 2018-02-23 ENCOUNTER — Other Ambulatory Visit (HOSPITAL_COMMUNITY): Payer: Self-pay | Admitting: Urology

## 2018-02-23 DIAGNOSIS — D49512 Neoplasm of unspecified behavior of left kidney: Secondary | ICD-10-CM

## 2018-03-17 ENCOUNTER — Ambulatory Visit (HOSPITAL_COMMUNITY)
Admission: RE | Admit: 2018-03-17 | Discharge: 2018-03-17 | Disposition: A | Discharge status: Home / Self Care | Payer: BLUE CROSS/BLUE SHIELD | Source: Ambulatory Visit | Attending: Urology | Admitting: Urology

## 2018-03-17 ENCOUNTER — Other Ambulatory Visit: Payer: Self-pay | Admitting: Urology

## 2018-03-17 DIAGNOSIS — J9811 Atelectasis: Secondary | ICD-10-CM | POA: Diagnosis not present

## 2018-03-17 DIAGNOSIS — Z905 Acquired absence of kidney: Secondary | ICD-10-CM | POA: Insufficient documentation

## 2018-03-17 DIAGNOSIS — I700 Atherosclerosis of aorta: Secondary | ICD-10-CM | POA: Insufficient documentation

## 2018-03-17 DIAGNOSIS — D49512 Neoplasm of unspecified behavior of left kidney: Secondary | ICD-10-CM | POA: Diagnosis not present

## 2018-03-17 DIAGNOSIS — N2889 Other specified disorders of kidney and ureter: Secondary | ICD-10-CM | POA: Diagnosis not present

## 2018-03-17 DIAGNOSIS — Z85528 Personal history of other malignant neoplasm of kidney: Secondary | ICD-10-CM

## 2018-03-17 LAB — POCT I-STAT CREATININE: CREATININE: 1.1 mg/dL (ref 0.61–1.24)

## 2018-03-17 MED ORDER — GADOBUTROL 1 MMOL/ML IV SOLN
7.5000 mL | Freq: Once | INTRAVENOUS | Status: AC | PRN
  Administered 2018-03-17: 7 mL via INTRAVENOUS

## 2018-04-20 DIAGNOSIS — C641 Malignant neoplasm of right kidney, except renal pelvis: Secondary | ICD-10-CM | POA: Diagnosis not present

## 2018-04-20 DIAGNOSIS — D49512 Neoplasm of unspecified behavior of left kidney: Secondary | ICD-10-CM | POA: Diagnosis not present

## 2018-07-13 DIAGNOSIS — M5412 Radiculopathy, cervical region: Secondary | ICD-10-CM | POA: Diagnosis not present

## 2018-07-13 DIAGNOSIS — Z79891 Long term (current) use of opiate analgesic: Secondary | ICD-10-CM | POA: Diagnosis not present

## 2018-07-13 DIAGNOSIS — R030 Elevated blood-pressure reading, without diagnosis of hypertension: Secondary | ICD-10-CM | POA: Diagnosis not present

## 2018-07-13 DIAGNOSIS — Z6825 Body mass index (BMI) 25.0-25.9, adult: Secondary | ICD-10-CM | POA: Diagnosis not present

## 2018-07-13 DIAGNOSIS — Z5181 Encounter for therapeutic drug level monitoring: Secondary | ICD-10-CM | POA: Diagnosis not present

## 2018-07-13 DIAGNOSIS — M5030 Other cervical disc degeneration, unspecified cervical region: Secondary | ICD-10-CM | POA: Diagnosis not present

## 2018-07-13 DIAGNOSIS — Z79899 Other long term (current) drug therapy: Secondary | ICD-10-CM | POA: Diagnosis not present

## 2018-07-21 ENCOUNTER — Encounter: Payer: Self-pay | Admitting: Adult Health

## 2018-07-21 ENCOUNTER — Ambulatory Visit: Payer: BLUE CROSS/BLUE SHIELD | Admitting: Adult Health

## 2018-07-21 VITALS — BP 123/75 | HR 70 | Ht 67.5 in | Wt 176.2 lb

## 2018-07-21 DIAGNOSIS — G4489 Other headache syndrome: Secondary | ICD-10-CM | POA: Diagnosis not present

## 2018-07-21 DIAGNOSIS — Q0700 Arnold-Chiari syndrome without spina bifida or hydrocephalus: Secondary | ICD-10-CM | POA: Diagnosis not present

## 2018-07-21 MED ORDER — TIZANIDINE HCL 4 MG PO TABS: 4.0000 mg | ORAL_TABLET | Freq: Three times a day (TID) | ORAL | 1 refills | Status: DC

## 2018-07-21 NOTE — Progress Notes (Signed)
PATIENT: Tony Richardson DOB: [birth year]  REASON FOR VISIT: follow up HISTORY FROM: patient  Chief Complaint  Patient presents with  . Headache    rm 5, "overall I am doing fine, improvement in my headaches"  . Follow-up     HISTORY OF PRESENT ILLNESS: Today 07/21/18 Tony Richardson is a 53 year old male here today for follow up on headaches. He has a history of Arnold-Chiari malformation, s/p suboccipital craniectomy (08/2012) and cervical decompression/fusion (03/2017.) He continues tizanidine with good relief of headaches. He has a slight tension headache in the back of his head and top of neck daily. He feels that when he takes 4mg  of tizanidine he feels much better. This was making him too sleepy but he feels that he is tolerating increased dose well now. He usually takes tizanidine 2-3 times daily. He reports that his pain management provider is treating pain with Nucynta and gabapentin. He feels that his pain is well managed at this time despite mild daily headaches and is not interested in changing or adding medications. He has regular follow up with pain management.   HISTORY: (copied from [place] note on 11/18/2017) Tony Richardson is a 53 year old male with a history of Arnold-Chiari malformation with suboccipital craniectomy as well as headaches.  He returns today for follow-up.  He states that he always has a dull ache in the occipital region.  He reports that he did try taking tizanidine 4 mg 3 times a day but it made him too drowsy.  He also did not see any increase in the benefit.  He reduced his dose to 2 mg 3 times a day.  He reports that at this time his pain is manageable.  He does see a pain specialist who placed him on Nucynta.  He states that he does not use neck exercises and stretches regularly.  He reports he does try to stay active.  He returns today for evaluation.   REVIEW OF SYSTEMS: Out of a complete 14 system review of symptoms, the patient complains only of the  following symptoms, insomnia, frequent waking, snoring, back pain, neck pain, neck stiffness, headaches, numbness, weakness and all other reviewed systems are negative.  ALLERGIES: Allergies  Allergen Reactions  . Other Other (See Comments)    Per Pt needs to avoid STEROIDs products due to his eyes.  UNSPECIFIED REACTION     HOME MEDICATIONS: Outpatient Medications Prior to Visit  Medication Sig Dispense Refill  . gabapentin (NEURONTIN) 100 MG capsule TAKE 1 CAPSULE BY MOUTH TWICE DAILY (AM & NOON) AND THEN TAKE 3 CAPSULES AT BEDTIME    . NUCYNTA 50 MG tablet Take 50 mg by mouth every 8 (eight) hours as needed.   0  . tiZANidine (ZANAFLEX) 2 MG tablet Take 1 tablet (2 mg total) by mouth 3 (three) times daily. 90 tablet 5  . busPIRone (BUSPAR) 10 MG tablet Take 10 mg by mouth daily as needed (anxiety).     . gabapentin (NEURONTIN) 400 MG capsule Take 400 mg by mouth 2 (two) times daily.     No facility-administered medications prior to visit.     PAST MEDICAL HISTORY: Past Medical History:  Diagnosis Date  . Anxiety   . Arnold-Chiari deformity ([place]) 04/19/2015   lots of head pain  . Arthritis   . Chronic back pain    spondylolisthesis  . Depression   . Dizziness   . Facial numbness 04/19/2015  . History of blood transfusion  2 at birth.Issue was b/c of parents blood types per pt  . History of bronchitis    many yrs ago  . History of colon polyps    benign  . History of shingles   . Nocturia   . Pneumonia    hx of about 5-6 yrs ago  . PONV (postoperative nausea and vomiting)    last procedure with neck fusion ; "i got sick"   . Urinary frequency    d/t prostate infection 15 yrs ago   . Weakness    numbness and tingling both hands and feet    PAST SURGICAL HISTORY: Past Surgical History:  Procedure Laterality Date  . ANTERIOR CERVICAL DECOMP/DISCECTOMY FUSION N/A 09/07/2016   Procedure: Anterior Cervical Decompression Fusion - Cervical five-Cervical six -  Cervical six-Cervical seven;  Surgeon: Jovita Gamma, MD;  Location: [facility];  Service: Neurosurgery;  Laterality: N/A;  . COLONOSCOPY    . CRANIECTOMY SUBOCCIPITAL W/ CERVICAL LAMINECTOMY / CHIARI    . ESOPHAGOGASTRODUODENOSCOPY    . growth removed from back of right ear     as a child  . OPERATIVE ULTRASOUND Right 11/19/2016   Procedure: Sharlett Iles ULTRASOUND;  Surgeon: Raynelle Bring, MD;  Location: [facility];  Service: Urology;  Laterality: Right;  . ROBOTIC ASSITED PARTIAL NEPHRECTOMY Right 11/19/2016   Procedure: XI ROBOTIC ASSITED PARTIAL RIGHT NEPHRECTOMY;  Surgeon: Raynelle Bring, MD;  Location: [facility];  Service: Urology;  Laterality: Right;  . SUBOCCIPITAL CRANIECTOMY CERVICAL LAMINECTOMY N/A 04/07/2013   Procedure: SUBOCCIPITAL CRANIECTOMY CERVICAL LAMINECTOMY/DURAPLASTY RESECTION POSTERIOR CERVICAL ONE;  Surgeon: Floyce Stakes, MD;  Location: [facility];  Service: Neurosurgery;  Laterality: N/A;  . THUMB ARTHROSCOPY Right 1991    FAMILY HISTORY: Family History  Problem Relation Age of Onset  . Hypertension Brother   . Colon cancer Maternal Aunt   . Colon cancer Paternal Aunt     SOCIAL HISTORY: Social History   Socioeconomic History  . Marital status: Married    Spouse name: Not on file  . Number of children: 1  . Years of education: 26  . Highest education level: Not on file  Occupational History  . Occupation: unemployed  Social Needs  . Financial resource strain: Not on file  . Food insecurity:    Worry: Not on file    Inability: Not on file  . Transportation needs:    Medical: Not on file    Non-medical: Not on file  Tobacco Use  . Smoking status: Never Smoker  . Smokeless tobacco: Never Used  Substance and Sexual Activity  . Alcohol use: No  . Drug use: No  . Sexual activity: Yes  Lifestyle  . Physical activity:    Days per week: Not on file    Minutes per session: Not on file  . Stress: Not on file  Relationships  . Social connections:     Talks on phone: Not on file    Gets together: Not on file    Attends religious service: Not on file    Active member of club or organization: Not on file    Attends meetings of clubs or organizations: Not on file    Relationship status: Not on file  . Intimate partner violence:    Fear of current or ex partner: Not on file    Emotionally abused: Not on file    Physically abused: Not on file    Forced sexual activity: Not on file  Other Topics Concern  . Not on  file  Social History Narrative   Patient drinks about 3-4 sodas daily.   Patient is right handed.       PHYSICAL EXAM  Vitals:   07/21/18 0845  BP: 123/75  Pulse: 70  Weight: 176 lb 3.2 oz (79.9 kg)  Height: 5' 7.5" (1.715 m)   Body mass index is 27.19 kg/m.  Generalized: Well developed, in no acute distress  Cardiology: normal rate and rhythm, no murmur noted Neurological examination  Mentation: Alert oriented to time, place, history taking. Follows all commands speech and language fluent Cranial nerve II-XII: Pupils were equal round reactive to light. Extraocular movements were full, visual field were full on confrontational test. Facial sensation and strength were normal. Uvula tongue midline. Head turning and shoulder shrug  were normal and symmetric. Motor: The motor testing reveals 5 over 5 strength of all 4 extremities. Good symmetric motor tone is noted throughout.  Sensory: Sensory testing is intact to soft touch on all 4 extremities. No evidence of extinction is noted.  Coordination: Cerebellar testing reveals good finger-nose-finger  Gait and station: Gait is normal. .   DIAGNOSTIC DATA (LABS, IMAGING, TESTING) - I reviewed patient records, labs, notes, testing and imaging myself where available.  No flowsheet data found.   Lab Results  Component Value Date   WBC 7.5 11/12/2016   HGB 12.2 (L) 11/21/2016   HCT 36.5 (L) 11/21/2016   MCV 87.7 11/12/2016   PLT 167 11/12/2016      Component Value  Date/Time   NA 136 11/21/2016 0512   NA 143 04/19/2015 1017   K 3.4 (L) 11/21/2016 0512   CL 103 11/21/2016 0512   CO2 25 11/21/2016 0512   GLUCOSE 125 (H) 11/21/2016 0512   BUN 7 11/21/2016 0512   BUN 11 04/19/2015 1017   CREATININE 1.10 03/17/2018 1024   CALCIUM 8.3 (L) 11/21/2016 0512   PROT 7.2 04/19/2015 1017   ALBUMIN 4.7 04/19/2015 1017   AST 17 04/19/2015 1017   ALT 15 04/19/2015 1017   ALKPHOS 95 04/19/2015 1017   BILITOT 0.4 04/19/2015 1017   GFRNONAA >60 11/21/2016 0512   GFRAA >60 11/21/2016 0512   No results found for: CHOL, HDL, LDLCALC, LDLDIRECT, TRIG, CHOLHDL No results found for: HGBA1C Lab Results  Component Value Date   VITAMINB12 311 04/19/2015   No results found for: TSH   ASSESSMENT AND PLAN 53 y.o. year old male  has a past medical history of Anxiety, Arnold-Chiari deformity ([place]) (04/19/2015), Arthritis, Chronic back pain, Depression, Dizziness, Facial numbness (04/19/2015), History of blood transfusion, History of bronchitis, History of colon polyps, History of shingles, Nocturia, Pneumonia, PONV (postoperative nausea and vomiting), Urinary frequency, and Weakness. here with     ICD-10-CM   1. Headache syndrome G44.89   2. Arnold-Chiari deformity ([facility]) Q07.00     He is feeling well today. We will increase dose of tizanidine to 4mg  TID as needed. He was advised to limit doses to as needed only. I have also advised that he talk to pain management regarding continuation of tizanidine. If willing, he can continue follow up regularly with pain management and call us as needed for worsening headaches. He verbalizes understanding. I have encouraged continued neck exercises and daily stretching. Follow up as needed.    No orders of the defined types were placed in this encounter.    Meds ordered this encounter  Medications  . tiZANidine (ZANAFLEX) 4 MG tablet    Sig: Take 1 tablet (4 mg total)  by mouth 3 (three) times daily.    Dispense:  270 tablet     Refill:  1    Order Specific Question:   Supervising Provider    Answer:   Melvenia Beam [id number]      I spent 15 minutes with the patient. 50% of this time was spent counseling and educating patient on plan of care and medications.    Debbora Presto, FNP-C 07/21/2018, 9:34 AM [facility] [address], [place], [place] [address] [phone]

## 2018-07-21 NOTE — Patient Instructions (Signed)
Continue Neck exercises Tizanidine 4mg  three times daily as needed Continue close follow up with pain management (if they are willing to continue tizanidine we will follow up as needed, if not we will see you in 1 year)   Neck Exercises Neck exercises can be important for many reasons:  They can help you to improve and maintain flexibility in your neck. This can be especially important as you age.  They can help to make your neck stronger. This can make movement easier.  They can reduce or prevent neck pain.  They may help your upper back. Ask your health care provider which neck exercises would be best for you. Exercises to improve neck flexibility Neck stretch Repeat this exercise 3-5 times. 1. Do this exercise while standing or while sitting in a chair. 2. Place your feet flat on the floor, shoulder-width apart. 3. Slowly turn your head to the right. Turn it all the way to the right so you can look over your right shoulder. Do not tilt or tip your head. 4. Hold this position for 10-30 seconds. 5. Slowly turn your head to the left, to look over your left shoulder. 6. Hold this position for 10-30 seconds.  Neck retraction Repeat this exercise 8-10 times. Do this 3-4 times a day or as told by your health care provider. 1. Do this exercise while standing or while sitting in a sturdy chair. 2. Look straight ahead. Do not bend your neck. 3. Use your fingers to push your chin backward. Do not bend your neck for this movement. Continue to face straight ahead. If you are doing the exercise properly, you will feel a slight sensation in your throat and a stretch at the back of your neck. 4. Hold the stretch for 1-2 seconds. Relax and repeat. Exercises to improve neck strength Neck press Repeat this exercise 10 times. Do it first thing in the morning and right before bed or as told by your health care provider. 1. Lie on your back on a firm bed or on the floor with a pillow under your  head. 2. Use your neck muscles to push your head down on the pillow and straighten your spine. 3. Hold the position as well as you can. Keep your head facing up and your chin tucked. 4. Slowly count to 5 while holding this position. 5. Relax for a few seconds. Then repeat. Isometric strengthening Do a full set of these exercises 2 times a day or as told by your health care provider. 1. Sit in a supportive chair and place your hand on your forehead. 2. Push forward with your head and neck while pushing back with your hand. Hold for 10 seconds. 3. Relax. Then repeat the exercise 3 times. 4. Next, do thesequence again, this time putting your hand against the back of your head. Use your head and neck to push backward against the hand pressure. 5. Finally, do the same exercise on either side of your head, pushing sideways against the pressure of your hand. Prone head lifts Repeat this exercise 5 times. Do this 2 times a day or as told by your health care provider. 1. Lie face-down, resting on your elbows so that your chest and upper back are raised. 2. Start with your head facing downward, near your chest. Position your chin either on or near your chest. 3. Slowly lift your head upward. Lift until you are looking straight ahead. Then continue lifting your head as far back as you can  stretch. 4. Hold your head up for 5 seconds. Then slowly lower it to your starting position. Supine head lifts Repeat this exercise 8-10 times. Do this 2 times a day or as told by your health care provider. 1. Lie on your back, bending your knees to point to the ceiling and keeping your feet flat on the floor. 2. Lift your head slowly off the floor, raising your chin toward your chest. 3. Hold for 5 seconds. 4. Relax and repeat. Scapular retraction Repeat this exercise 5 times. Do this 2 times a day or as told by your health care provider. 1. Stand with your arms at your sides. Look straight ahead. 2. Slowly pull  both shoulders backward and downward until you feel a stretch between your shoulder blades in your upper back. 3. Hold for 10-30 seconds. 4. Relax and repeat. Contact a health care provider if:  Your neck pain or discomfort gets much worse when you do an exercise.  Your neck pain or discomfort does not improve within 2 hours after you exercise. If you have any of these problems, stop exercising right away. Do not do the exercises again unless your health care provider says that you can. Get help right away if:  You develop sudden, severe neck pain. If this happens, stop exercising right away. Do not do the exercises again unless your health care provider says that you can. This information is not intended to replace advice given to you by your health care provider. Make sure you discuss any questions you have with your health care provider. Document Released: 05/20/2015 Document Revised: 10/12/2017 Document Reviewed: 12/17/2014 Elsevier Interactive Patient Education  2019 [employer].

## 2018-08-05 ENCOUNTER — Other Ambulatory Visit (HOSPITAL_COMMUNITY): Payer: Self-pay | Admitting: Urology

## 2018-08-05 DIAGNOSIS — D49512 Neoplasm of unspecified behavior of left kidney: Secondary | ICD-10-CM

## 2018-08-05 DIAGNOSIS — C641 Malignant neoplasm of right kidney, except renal pelvis: Secondary | ICD-10-CM

## 2018-08-25 DIAGNOSIS — Z5181 Encounter for therapeutic drug level monitoring: Secondary | ICD-10-CM | POA: Diagnosis not present

## 2018-08-25 DIAGNOSIS — M5030 Other cervical disc degeneration, unspecified cervical region: Secondary | ICD-10-CM | POA: Diagnosis not present

## 2018-08-25 DIAGNOSIS — R030 Elevated blood-pressure reading, without diagnosis of hypertension: Secondary | ICD-10-CM | POA: Diagnosis not present

## 2018-08-25 DIAGNOSIS — Z6825 Body mass index (BMI) 25.0-25.9, adult: Secondary | ICD-10-CM | POA: Diagnosis not present

## 2018-08-25 DIAGNOSIS — Z79891 Long term (current) use of opiate analgesic: Secondary | ICD-10-CM | POA: Diagnosis not present

## 2018-08-25 DIAGNOSIS — M5412 Radiculopathy, cervical region: Secondary | ICD-10-CM | POA: Diagnosis not present

## 2018-08-25 DIAGNOSIS — Z79899 Other long term (current) drug therapy: Secondary | ICD-10-CM | POA: Diagnosis not present

## 2018-10-04 DIAGNOSIS — F411 Generalized anxiety disorder: Secondary | ICD-10-CM | POA: Diagnosis not present

## 2018-10-04 DIAGNOSIS — F331 Major depressive disorder, recurrent, moderate: Secondary | ICD-10-CM | POA: Diagnosis not present

## 2018-10-17 ENCOUNTER — Ambulatory Visit (HOSPITAL_COMMUNITY)
Admission: RE | Admit: 2018-10-17 | Discharge: 2018-10-17 | Disposition: A | Discharge status: Home / Self Care | Payer: BLUE CROSS/BLUE SHIELD | Source: Ambulatory Visit | Attending: Urology | Admitting: Urology

## 2018-10-17 ENCOUNTER — Encounter (HOSPITAL_COMMUNITY): Payer: Self-pay

## 2018-10-17 ENCOUNTER — Other Ambulatory Visit: Payer: Self-pay

## 2018-10-17 ENCOUNTER — Other Ambulatory Visit (HOSPITAL_COMMUNITY): Payer: Self-pay | Admitting: Urology

## 2018-10-17 ENCOUNTER — Ambulatory Visit (HOSPITAL_COMMUNITY): Payer: BLUE CROSS/BLUE SHIELD

## 2018-10-17 DIAGNOSIS — C641 Malignant neoplasm of right kidney, except renal pelvis: Secondary | ICD-10-CM

## 2018-10-17 DIAGNOSIS — D49512 Neoplasm of unspecified behavior of left kidney: Secondary | ICD-10-CM

## 2018-10-17 LAB — POCT I-STAT CREATININE: Creatinine, Ser: 1.2 mg/dL (ref 0.61–1.24)

## 2018-10-17 MED ORDER — GADOBUTROL 1 MMOL/ML IV SOLN
7.0000 mL | Freq: Once | INTRAVENOUS | Status: AC | PRN
  Administered 2018-10-17: 11:00:00 7 mL via INTRAVENOUS

## 2019-05-09 ENCOUNTER — Telehealth: Payer: Self-pay | Admitting: Adult Health

## 2019-05-09 MED ORDER — TIZANIDINE HCL 4 MG PO TABS: 4.0000 mg | ORAL_TABLET | Freq: Three times a day (TID) | ORAL | 0 refills | Status: DC

## 2019-05-09 NOTE — Telephone Encounter (Signed)
Pt is needing a refill on his tiZANidine (ZANAFLEX) 4 MG tablet sent in to the [facility] on [address]

## 2019-06-29 ENCOUNTER — Other Ambulatory Visit: Payer: Self-pay | Admitting: Adult Health

## 2019-07-03 ENCOUNTER — Other Ambulatory Visit: Payer: Self-pay | Admitting: Adult Health

## 2019-07-04 NOTE — Telephone Encounter (Signed)
Patient will need follow up for additional refills. May request refills from pain management. Does not need to follow up with [place] if pain management agrees to refill.

## 2019-09-26 ENCOUNTER — Other Ambulatory Visit: Payer: Self-pay | Admitting: Urology

## 2019-09-26 DIAGNOSIS — D49512 Neoplasm of unspecified behavior of left kidney: Secondary | ICD-10-CM

## 2019-10-08 ENCOUNTER — Ambulatory Visit
Admission: RE | Admit: 2019-10-08 | Discharge: 2019-10-08 | Disposition: A | Discharge status: Home / Self Care | Payer: Medicare Other | Source: Ambulatory Visit | Attending: Urology | Admitting: Urology

## 2019-10-08 ENCOUNTER — Other Ambulatory Visit: Payer: Self-pay

## 2019-10-08 DIAGNOSIS — D49512 Neoplasm of unspecified behavior of left kidney: Secondary | ICD-10-CM

## 2019-10-08 MED ORDER — GADOBENATE DIMEGLUMINE 529 MG/ML IV SOLN
15.0000 mL | Freq: Once | INTRAVENOUS | Status: AC | PRN
  Administered 2019-10-08: 15 mL via INTRAVENOUS

## 2019-10-26 ENCOUNTER — Ambulatory Visit (HOSPITAL_COMMUNITY)
Admission: RE | Admit: 2019-10-26 | Discharge: 2019-10-26 | Disposition: A | Discharge status: Home / Self Care | Payer: Medicare Other | Source: Ambulatory Visit | Attending: Urology | Admitting: Urology

## 2019-10-26 ENCOUNTER — Other Ambulatory Visit (HOSPITAL_COMMUNITY): Payer: Self-pay | Admitting: Urology

## 2019-10-26 ENCOUNTER — Other Ambulatory Visit: Payer: Self-pay

## 2019-10-26 DIAGNOSIS — Z85528 Personal history of other malignant neoplasm of kidney: Secondary | ICD-10-CM | POA: Diagnosis not present

## 2020-08-01 DIAGNOSIS — M4722 Other spondylosis with radiculopathy, cervical region: Secondary | ICD-10-CM | POA: Diagnosis not present

## 2020-08-01 DIAGNOSIS — M4802 Spinal stenosis, cervical region: Secondary | ICD-10-CM | POA: Diagnosis not present

## 2020-08-01 DIAGNOSIS — M960 Pseudarthrosis after fusion or arthrodesis: Secondary | ICD-10-CM | POA: Diagnosis not present

## 2020-08-01 DIAGNOSIS — M4322 Fusion of spine, cervical region: Secondary | ICD-10-CM | POA: Diagnosis not present

## 2020-09-13 DIAGNOSIS — R42 Dizziness and giddiness: Secondary | ICD-10-CM | POA: Diagnosis not present

## 2020-09-26 DIAGNOSIS — M4322 Fusion of spine, cervical region: Secondary | ICD-10-CM | POA: Diagnosis not present

## 2020-09-26 DIAGNOSIS — M960 Pseudarthrosis after fusion or arthrodesis: Secondary | ICD-10-CM | POA: Diagnosis not present

## 2020-09-26 DIAGNOSIS — M4722 Other spondylosis with radiculopathy, cervical region: Secondary | ICD-10-CM | POA: Diagnosis not present

## 2020-10-14 DIAGNOSIS — Z79899 Other long term (current) drug therapy: Secondary | ICD-10-CM | POA: Diagnosis not present

## 2020-10-14 DIAGNOSIS — E7800 Pure hypercholesterolemia, unspecified: Secondary | ICD-10-CM | POA: Diagnosis not present

## 2020-10-14 DIAGNOSIS — Z0000 Encounter for general adult medical examination without abnormal findings: Secondary | ICD-10-CM | POA: Diagnosis not present

## 2020-10-14 DIAGNOSIS — E049 Nontoxic goiter, unspecified: Secondary | ICD-10-CM | POA: Diagnosis not present

## 2020-10-14 DIAGNOSIS — E039 Hypothyroidism, unspecified: Secondary | ICD-10-CM | POA: Diagnosis not present

## 2020-11-08 ENCOUNTER — Other Ambulatory Visit: Payer: Self-pay | Admitting: Urology

## 2020-11-08 DIAGNOSIS — D49512 Neoplasm of unspecified behavior of left kidney: Secondary | ICD-10-CM

## 2020-11-08 DIAGNOSIS — Z85528 Personal history of other malignant neoplasm of kidney: Secondary | ICD-10-CM

## 2020-11-21 ENCOUNTER — Ambulatory Visit
Admission: RE | Admit: 2020-11-21 | Discharge: 2020-11-21 | Disposition: A | Discharge status: Home / Self Care | Payer: Medicare Other | Source: Ambulatory Visit | Attending: Urology | Admitting: Urology

## 2020-11-21 ENCOUNTER — Other Ambulatory Visit: Payer: Self-pay

## 2020-11-21 ENCOUNTER — Other Ambulatory Visit: Payer: Self-pay | Admitting: Urology

## 2020-11-21 DIAGNOSIS — D49512 Neoplasm of unspecified behavior of left kidney: Secondary | ICD-10-CM

## 2020-11-21 DIAGNOSIS — N281 Cyst of kidney, acquired: Secondary | ICD-10-CM | POA: Diagnosis not present

## 2020-11-21 DIAGNOSIS — Z85528 Personal history of other malignant neoplasm of kidney: Secondary | ICD-10-CM | POA: Diagnosis not present

## 2020-11-21 DIAGNOSIS — Z981 Arthrodesis status: Secondary | ICD-10-CM | POA: Diagnosis not present

## 2020-11-21 DIAGNOSIS — M47814 Spondylosis without myelopathy or radiculopathy, thoracic region: Secondary | ICD-10-CM | POA: Diagnosis not present

## 2020-11-21 DIAGNOSIS — R918 Other nonspecific abnormal finding of lung field: Secondary | ICD-10-CM | POA: Diagnosis not present

## 2020-11-21 DIAGNOSIS — Z9889 Other specified postprocedural states: Secondary | ICD-10-CM | POA: Diagnosis not present

## 2020-11-21 DIAGNOSIS — N2889 Other specified disorders of kidney and ureter: Secondary | ICD-10-CM | POA: Diagnosis not present

## 2020-11-21 DIAGNOSIS — C641 Malignant neoplasm of right kidney, except renal pelvis: Secondary | ICD-10-CM | POA: Diagnosis not present

## 2020-11-21 MED ORDER — GADOBENATE DIMEGLUMINE 529 MG/ML IV SOLN
15.0000 mL | Freq: Once | INTRAVENOUS | Status: AC | PRN
  Administered 2020-11-21: 15 mL via INTRAVENOUS

## 2020-11-26 DIAGNOSIS — M960 Pseudarthrosis after fusion or arthrodesis: Secondary | ICD-10-CM | POA: Diagnosis not present

## 2020-11-26 DIAGNOSIS — Z85528 Personal history of other malignant neoplasm of kidney: Secondary | ICD-10-CM | POA: Diagnosis not present

## 2020-11-26 DIAGNOSIS — M4322 Fusion of spine, cervical region: Secondary | ICD-10-CM | POA: Diagnosis not present

## 2020-11-26 DIAGNOSIS — M4722 Other spondylosis with radiculopathy, cervical region: Secondary | ICD-10-CM | POA: Diagnosis not present

## 2020-12-05 DIAGNOSIS — D49511 Neoplasm of unspecified behavior of right kidney: Secondary | ICD-10-CM | POA: Diagnosis not present

## 2020-12-05 DIAGNOSIS — N281 Cyst of kidney, acquired: Secondary | ICD-10-CM | POA: Diagnosis not present

## 2020-12-20 ENCOUNTER — Other Ambulatory Visit: Payer: Self-pay | Admitting: Adult Health

## 2020-12-20 ENCOUNTER — Ambulatory Visit
Admission: RE | Admit: 2020-12-20 | Discharge: 2020-12-20 | Disposition: A | Discharge status: Home / Self Care | Payer: Medicare Other | Source: Ambulatory Visit | Attending: Adult Health | Admitting: Adult Health

## 2020-12-20 DIAGNOSIS — R0602 Shortness of breath: Secondary | ICD-10-CM | POA: Diagnosis not present

## 2020-12-20 DIAGNOSIS — Z85528 Personal history of other malignant neoplasm of kidney: Secondary | ICD-10-CM

## 2020-12-20 DIAGNOSIS — R059 Cough, unspecified: Secondary | ICD-10-CM | POA: Diagnosis not present

## 2020-12-25 DIAGNOSIS — Z9889 Other specified postprocedural states: Secondary | ICD-10-CM | POA: Diagnosis not present

## 2020-12-25 DIAGNOSIS — M79602 Pain in left arm: Secondary | ICD-10-CM | POA: Diagnosis not present

## 2020-12-25 DIAGNOSIS — M542 Cervicalgia: Secondary | ICD-10-CM | POA: Diagnosis not present

## 2020-12-25 DIAGNOSIS — M545 Low back pain, unspecified: Secondary | ICD-10-CM | POA: Diagnosis not present

## 2020-12-25 DIAGNOSIS — G8929 Other chronic pain: Secondary | ICD-10-CM | POA: Diagnosis not present

## 2020-12-25 DIAGNOSIS — R519 Headache, unspecified: Secondary | ICD-10-CM | POA: Diagnosis not present

## 2020-12-25 DIAGNOSIS — Z981 Arthrodesis status: Secondary | ICD-10-CM | POA: Diagnosis not present

## 2020-12-25 DIAGNOSIS — Q0700 Arnold-Chiari syndrome without spina bifida or hydrocephalus: Secondary | ICD-10-CM | POA: Diagnosis not present

## 2021-01-14 DIAGNOSIS — E039 Hypothyroidism, unspecified: Secondary | ICD-10-CM | POA: Diagnosis not present

## 2021-01-14 DIAGNOSIS — Z0000 Encounter for general adult medical examination without abnormal findings: Secondary | ICD-10-CM | POA: Diagnosis not present

## 2021-01-14 DIAGNOSIS — Z79899 Other long term (current) drug therapy: Secondary | ICD-10-CM | POA: Diagnosis not present

## 2021-01-16 DIAGNOSIS — R110 Nausea: Secondary | ICD-10-CM | POA: Diagnosis not present

## 2021-01-16 DIAGNOSIS — M50221 Other cervical disc displacement at C4-C5 level: Secondary | ICD-10-CM | POA: Diagnosis not present

## 2021-01-16 DIAGNOSIS — R51 Headache with orthostatic component, not elsewhere classified: Secondary | ICD-10-CM | POA: Diagnosis not present

## 2021-01-16 DIAGNOSIS — H538 Other visual disturbances: Secondary | ICD-10-CM | POA: Diagnosis not present

## 2021-01-16 DIAGNOSIS — R202 Paresthesia of skin: Secondary | ICD-10-CM | POA: Diagnosis not present

## 2021-01-16 DIAGNOSIS — R200 Anesthesia of skin: Secondary | ICD-10-CM | POA: Diagnosis not present

## 2021-01-17 DIAGNOSIS — M961 Postlaminectomy syndrome, not elsewhere classified: Secondary | ICD-10-CM | POA: Diagnosis not present

## 2021-01-17 DIAGNOSIS — R519 Headache, unspecified: Secondary | ICD-10-CM | POA: Diagnosis not present

## 2021-01-28 DIAGNOSIS — Z79899 Other long term (current) drug therapy: Secondary | ICD-10-CM | POA: Diagnosis not present

## 2021-01-28 DIAGNOSIS — M4322 Fusion of spine, cervical region: Secondary | ICD-10-CM | POA: Diagnosis not present

## 2021-01-28 DIAGNOSIS — M960 Pseudarthrosis after fusion or arthrodesis: Secondary | ICD-10-CM | POA: Diagnosis not present

## 2021-01-28 DIAGNOSIS — M4722 Other spondylosis with radiculopathy, cervical region: Secondary | ICD-10-CM | POA: Diagnosis not present

## 2021-02-12 DIAGNOSIS — E039 Hypothyroidism, unspecified: Secondary | ICD-10-CM | POA: Diagnosis not present

## 2021-03-25 DIAGNOSIS — M4722 Other spondylosis with radiculopathy, cervical region: Secondary | ICD-10-CM | POA: Diagnosis not present

## 2021-03-25 DIAGNOSIS — M4322 Fusion of spine, cervical region: Secondary | ICD-10-CM | POA: Diagnosis not present

## 2021-03-25 DIAGNOSIS — M960 Pseudarthrosis after fusion or arthrodesis: Secondary | ICD-10-CM | POA: Diagnosis not present

## 2021-03-27 DIAGNOSIS — R519 Headache, unspecified: Secondary | ICD-10-CM | POA: Diagnosis not present

## 2021-03-27 DIAGNOSIS — G4730 Sleep apnea, unspecified: Secondary | ICD-10-CM | POA: Diagnosis not present

## 2021-04-22 DIAGNOSIS — M4322 Fusion of spine, cervical region: Secondary | ICD-10-CM | POA: Diagnosis not present

## 2021-04-22 DIAGNOSIS — M7918 Myalgia, other site: Secondary | ICD-10-CM | POA: Diagnosis not present

## 2021-04-22 DIAGNOSIS — M546 Pain in thoracic spine: Secondary | ICD-10-CM | POA: Diagnosis not present

## 2021-04-22 DIAGNOSIS — M960 Pseudarthrosis after fusion or arthrodesis: Secondary | ICD-10-CM | POA: Diagnosis not present

## 2021-04-22 DIAGNOSIS — M4722 Other spondylosis with radiculopathy, cervical region: Secondary | ICD-10-CM | POA: Diagnosis not present

## 2021-05-07 DIAGNOSIS — G4733 Obstructive sleep apnea (adult) (pediatric): Secondary | ICD-10-CM | POA: Diagnosis not present

## 2021-05-20 DIAGNOSIS — M4722 Other spondylosis with radiculopathy, cervical region: Secondary | ICD-10-CM | POA: Diagnosis not present

## 2021-05-20 DIAGNOSIS — M4322 Fusion of spine, cervical region: Secondary | ICD-10-CM | POA: Diagnosis not present

## 2021-05-20 DIAGNOSIS — M960 Pseudarthrosis after fusion or arthrodesis: Secondary | ICD-10-CM | POA: Diagnosis not present

## 2021-06-24 DIAGNOSIS — M4322 Fusion of spine, cervical region: Secondary | ICD-10-CM | POA: Diagnosis not present

## 2021-06-24 DIAGNOSIS — M960 Pseudarthrosis after fusion or arthrodesis: Secondary | ICD-10-CM | POA: Diagnosis not present

## 2021-06-24 DIAGNOSIS — M4722 Other spondylosis with radiculopathy, cervical region: Secondary | ICD-10-CM | POA: Diagnosis not present

## 2021-07-22 DIAGNOSIS — M960 Pseudarthrosis after fusion or arthrodesis: Secondary | ICD-10-CM | POA: Diagnosis not present

## 2021-07-22 DIAGNOSIS — M4322 Fusion of spine, cervical region: Secondary | ICD-10-CM | POA: Diagnosis not present

## 2021-08-19 DIAGNOSIS — M4322 Fusion of spine, cervical region: Secondary | ICD-10-CM | POA: Diagnosis not present

## 2021-08-19 DIAGNOSIS — M960 Pseudarthrosis after fusion or arthrodesis: Secondary | ICD-10-CM | POA: Diagnosis not present

## 2021-09-16 DIAGNOSIS — M5432 Sciatica, left side: Secondary | ICD-10-CM | POA: Diagnosis not present

## 2021-09-16 DIAGNOSIS — G894 Chronic pain syndrome: Secondary | ICD-10-CM | POA: Diagnosis not present

## 2021-09-16 DIAGNOSIS — Z79899 Other long term (current) drug therapy: Secondary | ICD-10-CM | POA: Diagnosis not present

## 2021-09-16 DIAGNOSIS — M4322 Fusion of spine, cervical region: Secondary | ICD-10-CM | POA: Diagnosis not present

## 2021-09-16 DIAGNOSIS — M960 Pseudarthrosis after fusion or arthrodesis: Secondary | ICD-10-CM | POA: Diagnosis not present

## 2021-09-16 DIAGNOSIS — Z79891 Long term (current) use of opiate analgesic: Secondary | ICD-10-CM | POA: Diagnosis not present

## 2021-10-01 ENCOUNTER — Other Ambulatory Visit: Payer: Self-pay | Admitting: Adult Health

## 2021-10-01 DIAGNOSIS — Z85528 Personal history of other malignant neoplasm of kidney: Secondary | ICD-10-CM

## 2021-10-14 DIAGNOSIS — M5432 Sciatica, left side: Secondary | ICD-10-CM | POA: Diagnosis not present

## 2021-10-14 DIAGNOSIS — M4322 Fusion of spine, cervical region: Secondary | ICD-10-CM | POA: Diagnosis not present

## 2021-10-14 DIAGNOSIS — M960 Pseudarthrosis after fusion or arthrodesis: Secondary | ICD-10-CM | POA: Diagnosis not present

## 2021-10-22 DIAGNOSIS — Z79899 Other long term (current) drug therapy: Secondary | ICD-10-CM | POA: Diagnosis not present

## 2021-10-22 DIAGNOSIS — E049 Nontoxic goiter, unspecified: Secondary | ICD-10-CM | POA: Diagnosis not present

## 2021-10-22 DIAGNOSIS — Z85528 Personal history of other malignant neoplasm of kidney: Secondary | ICD-10-CM | POA: Diagnosis not present

## 2021-10-22 DIAGNOSIS — I10 Essential (primary) hypertension: Secondary | ICD-10-CM | POA: Diagnosis not present

## 2021-10-22 DIAGNOSIS — Z0000 Encounter for general adult medical examination without abnormal findings: Secondary | ICD-10-CM | POA: Diagnosis not present

## 2021-10-22 DIAGNOSIS — E039 Hypothyroidism, unspecified: Secondary | ICD-10-CM | POA: Diagnosis not present

## 2021-10-22 DIAGNOSIS — E7800 Pure hypercholesterolemia, unspecified: Secondary | ICD-10-CM | POA: Diagnosis not present

## 2021-10-22 DIAGNOSIS — M549 Dorsalgia, unspecified: Secondary | ICD-10-CM | POA: Diagnosis not present

## 2021-11-11 DIAGNOSIS — M4322 Fusion of spine, cervical region: Secondary | ICD-10-CM | POA: Diagnosis not present

## 2021-11-11 DIAGNOSIS — M5432 Sciatica, left side: Secondary | ICD-10-CM | POA: Diagnosis not present

## 2021-11-11 DIAGNOSIS — M960 Pseudarthrosis after fusion or arthrodesis: Secondary | ICD-10-CM | POA: Diagnosis not present

## 2021-11-12 DIAGNOSIS — I10 Essential (primary) hypertension: Secondary | ICD-10-CM | POA: Diagnosis not present

## 2021-11-18 DIAGNOSIS — Z85528 Personal history of other malignant neoplasm of kidney: Secondary | ICD-10-CM | POA: Diagnosis not present

## 2021-11-20 ENCOUNTER — Other Ambulatory Visit: Payer: Self-pay | Admitting: Adult Health

## 2021-11-20 ENCOUNTER — Ambulatory Visit
Admission: RE | Admit: 2021-11-20 | Discharge: 2021-11-20 | Disposition: A | Discharge status: Home / Self Care | Payer: Medicare Other | Source: Ambulatory Visit | Attending: Adult Health | Admitting: Adult Health

## 2021-11-20 DIAGNOSIS — Z85528 Personal history of other malignant neoplasm of kidney: Secondary | ICD-10-CM

## 2021-11-20 DIAGNOSIS — J984 Other disorders of lung: Secondary | ICD-10-CM | POA: Diagnosis not present

## 2021-11-20 DIAGNOSIS — Z9889 Other specified postprocedural states: Secondary | ICD-10-CM | POA: Diagnosis not present

## 2021-11-20 DIAGNOSIS — N2889 Other specified disorders of kidney and ureter: Secondary | ICD-10-CM | POA: Diagnosis not present

## 2021-11-20 DIAGNOSIS — N281 Cyst of kidney, acquired: Secondary | ICD-10-CM | POA: Diagnosis not present

## 2021-11-20 DIAGNOSIS — Z905 Acquired absence of kidney: Secondary | ICD-10-CM | POA: Diagnosis not present

## 2021-11-20 MED ORDER — GADOBENATE DIMEGLUMINE 529 MG/ML IV SOLN
16.0000 mL | Freq: Once | INTRAVENOUS | Status: AC | PRN
  Administered 2021-11-20: 16 mL via INTRAVENOUS

## 2021-11-25 DIAGNOSIS — Z85528 Personal history of other malignant neoplasm of kidney: Secondary | ICD-10-CM | POA: Diagnosis not present

## 2021-12-09 DIAGNOSIS — M4322 Fusion of spine, cervical region: Secondary | ICD-10-CM | POA: Diagnosis not present

## 2021-12-09 DIAGNOSIS — M960 Pseudarthrosis after fusion or arthrodesis: Secondary | ICD-10-CM | POA: Diagnosis not present

## 2021-12-09 DIAGNOSIS — M5432 Sciatica, left side: Secondary | ICD-10-CM | POA: Diagnosis not present

## 2022-01-10 IMAGING — CR DG CHEST 2V
1 series · 1 of 1 positions shown · non-contrast
Comparison: 11/21/2020

CLINICAL DATA: History of kidney cancer. Shortness of breath and
cough. Question of pulmonary nodule seen on recent chest x-ray.

EXAM:
CHEST - 2 VIEW

[w chest pa]
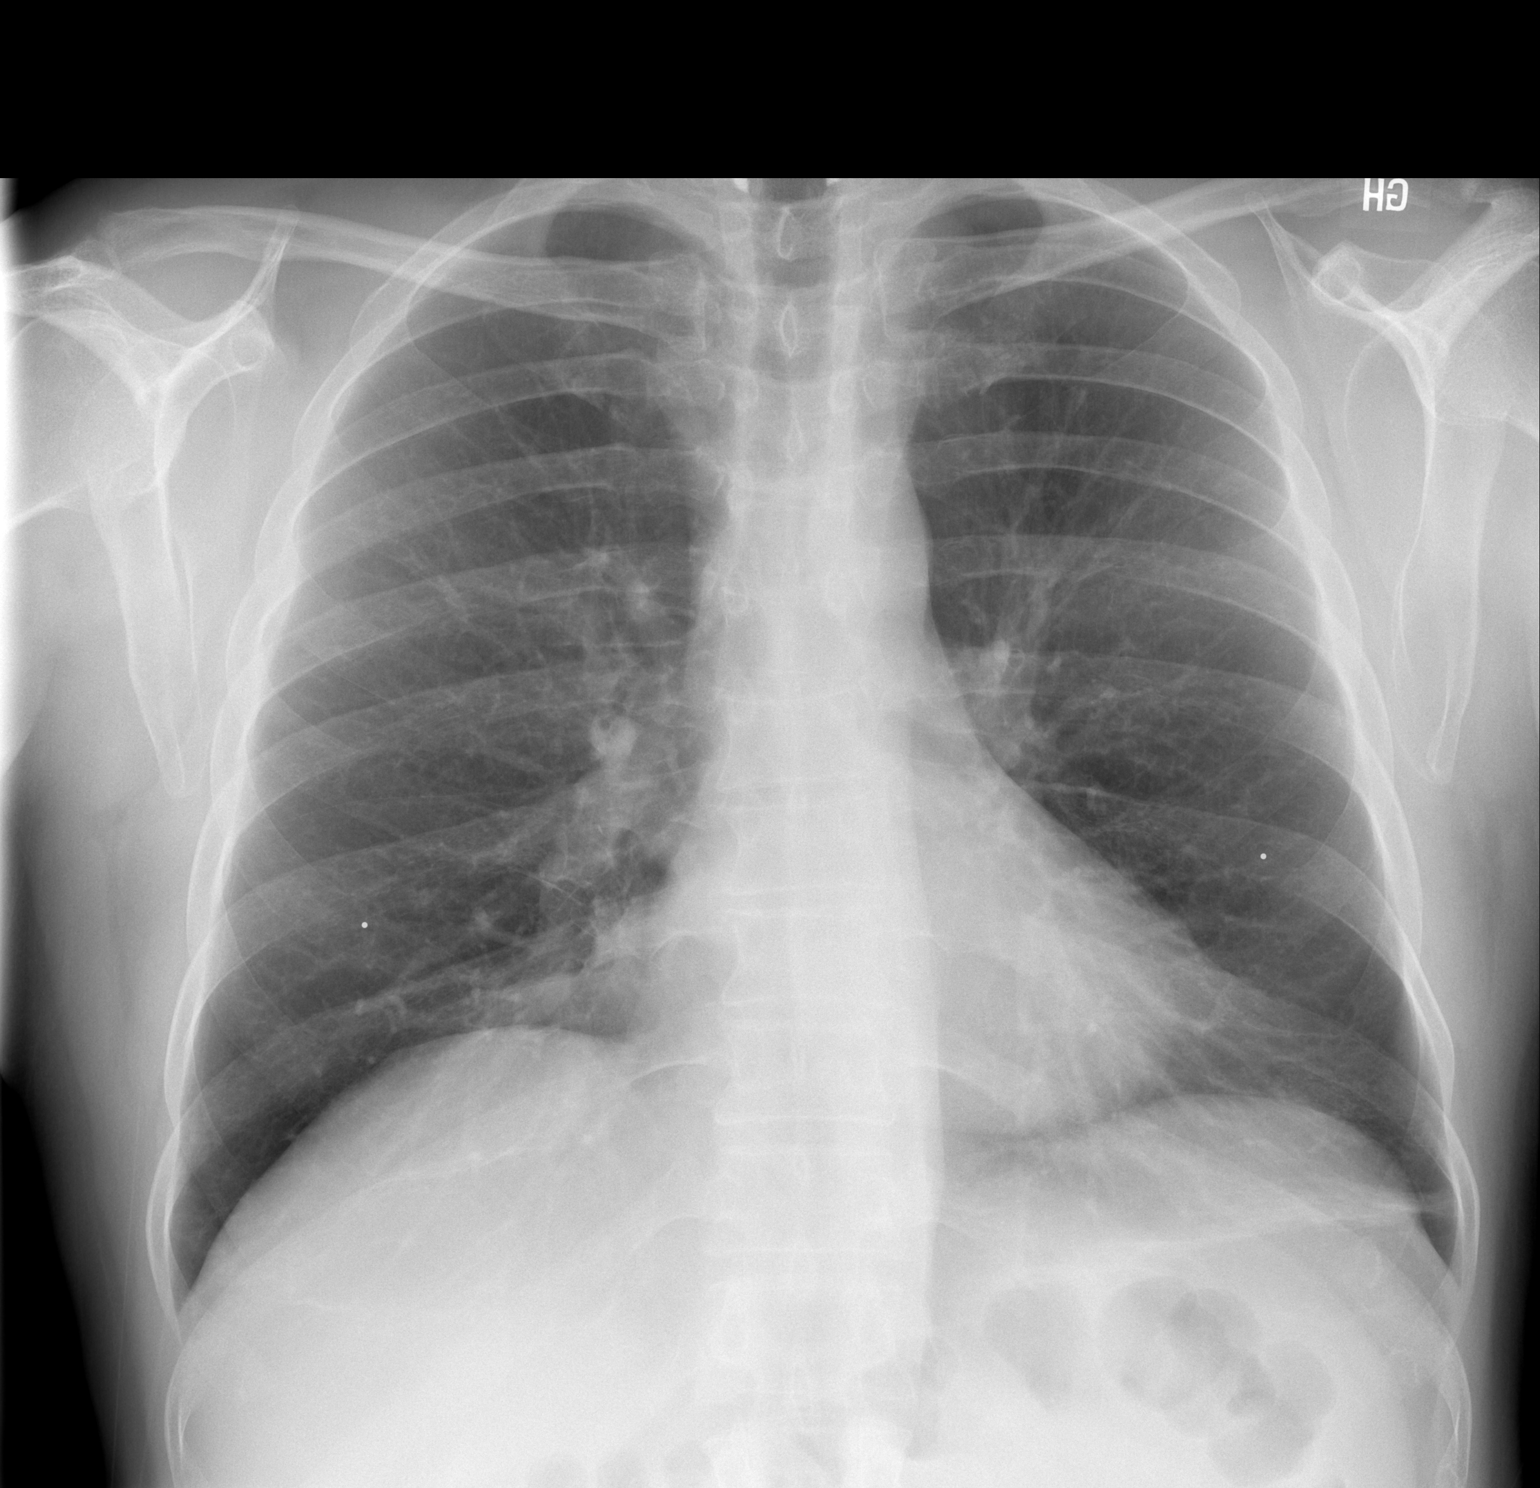

[1 of 1 positions shown; findings below may reference images not displayed]

FINDINGS: Nipple markers have been placed, showing no suspicious pulmonary
abnormality in the LOWER lobes. The heart size is normal. There is
streaky atelectasis at the LEFT lung base. Otherwise lungs are
clear. Status post cervical fusion.
IMPRESSION: No pulmonary nodules.

Subsegmental atelectasis in the LEFT lung base.

## 2022-01-20 DIAGNOSIS — M5432 Sciatica, left side: Secondary | ICD-10-CM | POA: Diagnosis not present

## 2022-01-20 DIAGNOSIS — M960 Pseudarthrosis after fusion or arthrodesis: Secondary | ICD-10-CM | POA: Diagnosis not present

## 2022-01-20 DIAGNOSIS — M4322 Fusion of spine, cervical region: Secondary | ICD-10-CM | POA: Diagnosis not present

## 2022-02-17 DIAGNOSIS — M5432 Sciatica, left side: Secondary | ICD-10-CM | POA: Diagnosis not present

## 2022-02-17 DIAGNOSIS — M4322 Fusion of spine, cervical region: Secondary | ICD-10-CM | POA: Diagnosis not present

## 2022-02-17 DIAGNOSIS — M960 Pseudarthrosis after fusion or arthrodesis: Secondary | ICD-10-CM | POA: Diagnosis not present

## 2022-04-14 DIAGNOSIS — M960 Pseudarthrosis after fusion or arthrodesis: Secondary | ICD-10-CM | POA: Diagnosis not present

## 2022-04-14 DIAGNOSIS — M5432 Sciatica, left side: Secondary | ICD-10-CM | POA: Diagnosis not present

## 2022-04-14 DIAGNOSIS — M4322 Fusion of spine, cervical region: Secondary | ICD-10-CM | POA: Diagnosis not present

## 2022-06-11 DIAGNOSIS — M4322 Fusion of spine, cervical region: Secondary | ICD-10-CM | POA: Diagnosis not present

## 2022-06-11 DIAGNOSIS — M960 Pseudarthrosis after fusion or arthrodesis: Secondary | ICD-10-CM | POA: Diagnosis not present

## 2022-06-11 DIAGNOSIS — G894 Chronic pain syndrome: Secondary | ICD-10-CM | POA: Diagnosis not present

## 2022-06-11 DIAGNOSIS — M5432 Sciatica, left side: Secondary | ICD-10-CM | POA: Diagnosis not present

## 2022-06-11 DIAGNOSIS — Z79891 Long term (current) use of opiate analgesic: Secondary | ICD-10-CM | POA: Diagnosis not present

## 2022-06-11 DIAGNOSIS — Z79899 Other long term (current) drug therapy: Secondary | ICD-10-CM | POA: Diagnosis not present

## 2022-07-08 DIAGNOSIS — M7712 Lateral epicondylitis, left elbow: Secondary | ICD-10-CM | POA: Diagnosis not present

## 2022-08-11 DIAGNOSIS — M5432 Sciatica, left side: Secondary | ICD-10-CM | POA: Diagnosis not present

## 2022-08-11 DIAGNOSIS — M960 Pseudarthrosis after fusion or arthrodesis: Secondary | ICD-10-CM | POA: Diagnosis not present

## 2022-08-11 DIAGNOSIS — M4322 Fusion of spine, cervical region: Secondary | ICD-10-CM | POA: Diagnosis not present

## 2022-10-13 DIAGNOSIS — M4322 Fusion of spine, cervical region: Secondary | ICD-10-CM | POA: Diagnosis not present

## 2022-10-13 DIAGNOSIS — M960 Pseudarthrosis after fusion or arthrodesis: Secondary | ICD-10-CM | POA: Diagnosis not present

## 2022-10-13 DIAGNOSIS — M5432 Sciatica, left side: Secondary | ICD-10-CM | POA: Diagnosis not present

## 2022-10-22 DIAGNOSIS — E7800 Pure hypercholesterolemia, unspecified: Secondary | ICD-10-CM | POA: Diagnosis not present

## 2022-10-22 DIAGNOSIS — Z79899 Other long term (current) drug therapy: Secondary | ICD-10-CM | POA: Diagnosis not present

## 2022-10-22 DIAGNOSIS — E039 Hypothyroidism, unspecified: Secondary | ICD-10-CM | POA: Diagnosis not present

## 2022-10-29 DIAGNOSIS — Z79899 Other long term (current) drug therapy: Secondary | ICD-10-CM | POA: Diagnosis not present

## 2022-10-29 DIAGNOSIS — E7800 Pure hypercholesterolemia, unspecified: Secondary | ICD-10-CM | POA: Diagnosis not present

## 2022-10-29 DIAGNOSIS — I10 Essential (primary) hypertension: Secondary | ICD-10-CM | POA: Diagnosis not present

## 2022-10-29 DIAGNOSIS — Z0000 Encounter for general adult medical examination without abnormal findings: Secondary | ICD-10-CM | POA: Diagnosis not present

## 2022-10-29 DIAGNOSIS — E039 Hypothyroidism, unspecified: Secondary | ICD-10-CM | POA: Diagnosis not present

## 2022-12-08 DIAGNOSIS — M4322 Fusion of spine, cervical region: Secondary | ICD-10-CM | POA: Diagnosis not present

## 2022-12-08 DIAGNOSIS — M960 Pseudarthrosis after fusion or arthrodesis: Secondary | ICD-10-CM | POA: Diagnosis not present

## 2022-12-08 DIAGNOSIS — M5432 Sciatica, left side: Secondary | ICD-10-CM | POA: Diagnosis not present

## 2023-02-09 DIAGNOSIS — G894 Chronic pain syndrome: Secondary | ICD-10-CM | POA: Diagnosis not present

## 2023-02-09 DIAGNOSIS — M7918 Myalgia, other site: Secondary | ICD-10-CM | POA: Diagnosis not present

## 2023-02-09 DIAGNOSIS — M5432 Sciatica, left side: Secondary | ICD-10-CM | POA: Diagnosis not present

## 2023-02-09 DIAGNOSIS — Z79891 Long term (current) use of opiate analgesic: Secondary | ICD-10-CM | POA: Diagnosis not present

## 2023-02-09 DIAGNOSIS — Z79899 Other long term (current) drug therapy: Secondary | ICD-10-CM | POA: Diagnosis not present

## 2023-02-09 DIAGNOSIS — M960 Pseudarthrosis after fusion or arthrodesis: Secondary | ICD-10-CM | POA: Diagnosis not present

## 2023-02-09 DIAGNOSIS — M4322 Fusion of spine, cervical region: Secondary | ICD-10-CM | POA: Diagnosis not present

## 2023-04-13 DIAGNOSIS — M5432 Sciatica, left side: Secondary | ICD-10-CM | POA: Diagnosis not present

## 2023-04-13 DIAGNOSIS — M4322 Fusion of spine, cervical region: Secondary | ICD-10-CM | POA: Diagnosis not present

## 2023-04-13 DIAGNOSIS — M960 Pseudarthrosis after fusion or arthrodesis: Secondary | ICD-10-CM | POA: Diagnosis not present

## 2023-04-13 DIAGNOSIS — M7918 Myalgia, other site: Secondary | ICD-10-CM | POA: Diagnosis not present

## 2023-06-08 DIAGNOSIS — M960 Pseudarthrosis after fusion or arthrodesis: Secondary | ICD-10-CM | POA: Diagnosis not present

## 2023-06-08 DIAGNOSIS — M5432 Sciatica, left side: Secondary | ICD-10-CM | POA: Diagnosis not present

## 2023-06-08 DIAGNOSIS — M4322 Fusion of spine, cervical region: Secondary | ICD-10-CM | POA: Diagnosis not present

## 2023-08-10 DIAGNOSIS — M5432 Sciatica, left side: Secondary | ICD-10-CM | POA: Diagnosis not present

## 2023-08-10 DIAGNOSIS — M4322 Fusion of spine, cervical region: Secondary | ICD-10-CM | POA: Diagnosis not present

## 2023-08-10 DIAGNOSIS — M960 Pseudarthrosis after fusion or arthrodesis: Secondary | ICD-10-CM | POA: Diagnosis not present

## 2023-10-05 DIAGNOSIS — M5432 Sciatica, left side: Secondary | ICD-10-CM | POA: Diagnosis not present

## 2023-10-05 DIAGNOSIS — M960 Pseudarthrosis after fusion or arthrodesis: Secondary | ICD-10-CM | POA: Diagnosis not present

## 2023-10-05 DIAGNOSIS — M4322 Fusion of spine, cervical region: Secondary | ICD-10-CM | POA: Diagnosis not present

## 2023-11-03 DIAGNOSIS — E7800 Pure hypercholesterolemia, unspecified: Secondary | ICD-10-CM | POA: Diagnosis not present

## 2023-11-03 DIAGNOSIS — I10 Essential (primary) hypertension: Secondary | ICD-10-CM | POA: Diagnosis not present

## 2023-11-03 DIAGNOSIS — E039 Hypothyroidism, unspecified: Secondary | ICD-10-CM | POA: Diagnosis not present

## 2023-11-09 DIAGNOSIS — E7800 Pure hypercholesterolemia, unspecified: Secondary | ICD-10-CM | POA: Diagnosis not present

## 2023-11-09 DIAGNOSIS — Z0000 Encounter for general adult medical examination without abnormal findings: Secondary | ICD-10-CM | POA: Diagnosis not present

## 2023-11-09 DIAGNOSIS — I10 Essential (primary) hypertension: Secondary | ICD-10-CM | POA: Diagnosis not present

## 2023-11-09 DIAGNOSIS — E039 Hypothyroidism, unspecified: Secondary | ICD-10-CM | POA: Diagnosis not present

## 2023-11-09 DIAGNOSIS — H6122 Impacted cerumen, left ear: Secondary | ICD-10-CM | POA: Diagnosis not present

## 2023-11-09 DIAGNOSIS — Z79899 Other long term (current) drug therapy: Secondary | ICD-10-CM | POA: Diagnosis not present

## 2023-12-07 DIAGNOSIS — G894 Chronic pain syndrome: Secondary | ICD-10-CM | POA: Diagnosis not present

## 2023-12-07 DIAGNOSIS — M4322 Fusion of spine, cervical region: Secondary | ICD-10-CM | POA: Diagnosis not present

## 2023-12-07 DIAGNOSIS — M5442 Lumbago with sciatica, left side: Secondary | ICD-10-CM | POA: Diagnosis not present

## 2023-12-07 DIAGNOSIS — S129XXS Fracture of neck, unspecified, sequela: Secondary | ICD-10-CM | POA: Diagnosis not present

## 2024-02-08 DIAGNOSIS — M4322 Fusion of spine, cervical region: Secondary | ICD-10-CM | POA: Diagnosis not present

## 2024-02-08 DIAGNOSIS — G894 Chronic pain syndrome: Secondary | ICD-10-CM | POA: Diagnosis not present

## 2024-02-08 DIAGNOSIS — M5432 Sciatica, left side: Secondary | ICD-10-CM | POA: Diagnosis not present

## 2024-02-08 DIAGNOSIS — S129XXS Fracture of neck, unspecified, sequela: Secondary | ICD-10-CM | POA: Diagnosis not present

## 2024-04-11 DIAGNOSIS — M4326 Fusion of spine, lumbar region: Secondary | ICD-10-CM | POA: Diagnosis not present

## 2024-04-11 DIAGNOSIS — G894 Chronic pain syndrome: Secondary | ICD-10-CM | POA: Diagnosis not present

## 2024-04-11 DIAGNOSIS — M961 Postlaminectomy syndrome, not elsewhere classified: Secondary | ICD-10-CM | POA: Diagnosis not present

## 2024-04-11 DIAGNOSIS — M5432 Sciatica, left side: Secondary | ICD-10-CM | POA: Diagnosis not present

## 2024-04-11 DIAGNOSIS — M5442 Lumbago with sciatica, left side: Secondary | ICD-10-CM | POA: Diagnosis not present

## 2024-04-11 DIAGNOSIS — M4322 Fusion of spine, cervical region: Secondary | ICD-10-CM | POA: Diagnosis not present

## 2024-04-13 DIAGNOSIS — G8929 Other chronic pain: Secondary | ICD-10-CM | POA: Diagnosis not present

## 2024-04-13 DIAGNOSIS — M5442 Lumbago with sciatica, left side: Secondary | ICD-10-CM | POA: Diagnosis not present

## 2024-04-13 DIAGNOSIS — M5432 Sciatica, left side: Secondary | ICD-10-CM | POA: Diagnosis not present
